# Patient Record
Sex: Male | Born: 1985 | Hispanic: No | Smoking: Never smoker
Health system: Southern US, Community
[De-identification: ages and names within clinical notes are randomized; demographics above are authoritative.]

## PROBLEM LIST (undated history)

## (undated) DIAGNOSIS — F329 Major depressive disorder, single episode, unspecified: Secondary | ICD-10-CM

## (undated) DIAGNOSIS — F32A Depression, unspecified: Secondary | ICD-10-CM

## (undated) DIAGNOSIS — K5792 Diverticulitis of intestine, part unspecified, without perforation or abscess without bleeding: Secondary | ICD-10-CM

## (undated) DIAGNOSIS — I10 Essential (primary) hypertension: Secondary | ICD-10-CM

## (undated) HISTORY — PX: NO PAST SURGERIES: SHX2092

## (undated) HISTORY — PX: COLONOSCOPY: SHX174

---

## 2009-12-14 ENCOUNTER — Emergency Department (HOSPITAL_COMMUNITY): Admission: EM | Admit: 2009-12-14 | Discharge: 2009-12-15 | Payer: Self-pay | Admitting: Emergency Medicine

## 2011-01-16 LAB — RAPID STREP SCREEN (MED CTR MEBANE ONLY): Streptococcus, Group A Screen (Direct): NEGATIVE

## 2011-07-15 ENCOUNTER — Ambulatory Visit: Payer: Self-pay | Admitting: Internal Medicine

## 2015-04-05 ENCOUNTER — Encounter (HOSPITAL_BASED_OUTPATIENT_CLINIC_OR_DEPARTMENT_OTHER): Payer: Self-pay

## 2015-04-05 ENCOUNTER — Emergency Department (HOSPITAL_BASED_OUTPATIENT_CLINIC_OR_DEPARTMENT_OTHER)
Admission: EM | Admit: 2015-04-05 | Discharge: 2015-04-05 | Disposition: A | Discharge status: Home / Self Care | Payer: Self-pay | Attending: Emergency Medicine | Admitting: Emergency Medicine

## 2015-04-05 ENCOUNTER — Emergency Department (HOSPITAL_BASED_OUTPATIENT_CLINIC_OR_DEPARTMENT_OTHER): Payer: Self-pay

## 2015-04-05 DIAGNOSIS — W228XXA Striking against or struck by other objects, initial encounter: Secondary | ICD-10-CM | POA: Insufficient documentation

## 2015-04-05 DIAGNOSIS — Y998 Other external cause status: Secondary | ICD-10-CM | POA: Insufficient documentation

## 2015-04-05 DIAGNOSIS — S9031XA Contusion of right foot, initial encounter: Secondary | ICD-10-CM | POA: Insufficient documentation

## 2015-04-05 DIAGNOSIS — I10 Essential (primary) hypertension: Secondary | ICD-10-CM | POA: Insufficient documentation

## 2015-04-05 DIAGNOSIS — Y9389 Activity, other specified: Secondary | ICD-10-CM | POA: Insufficient documentation

## 2015-04-05 DIAGNOSIS — Y9289 Other specified places as the place of occurrence of the external cause: Secondary | ICD-10-CM | POA: Insufficient documentation

## 2015-04-05 HISTORY — DX: Essential (primary) hypertension: I10

## 2015-04-05 MED ORDER — NAPROXEN 500 MG PO TABS: 500.0000 mg | ORAL_TABLET | Freq: Two times a day (BID) | ORAL | Status: DC

## 2015-04-05 NOTE — ED Provider Notes (Signed)
CSN: [id number]     Arrival date & time 04/05/15  1143 History   First MD Initiated Contact with Patient 04/05/15 1216     Chief Complaint  Patient presents with  . Foot Injury     (Consider location/radiation/quality/duration/timing/severity/associated sxs/prior Treatment) HPI Robert Chen is a 29 y.o. male with history of hypertension, presents to emergency department complaining of right foot injury. Patient states he was at work, where a forklift ran over his foot. He reports pain to the top of the foot. Able to bear weight. No pain with movement of the ankle or the foot. States tender to touch. Denies any other injuries. No treatment prior to coming in. No prior foot injuries.=  Past Medical History  Diagnosis Date  . Hypertension    History reviewed. No pertinent past surgical history. No family history on file. History  Substance Use Topics  . Smoking status: Never Smoker   . Smokeless tobacco: Not on file  . Alcohol Use: No    Review of Systems  Constitutional: Negative for fever and chills.  Musculoskeletal: Positive for joint swelling and arthralgias.  Neurological: Negative for weakness and numbness.      Allergies  Review of patient's allergies indicates no known allergies.  Home Medications   Prior to Admission medications   Medication Sig Start Date End Date Taking? Authorizing Provider  UNKNOWN TO PATIENT BP med   Yes Historical Provider, MD   BP 161/99 mmHg  Pulse 102  Temp(Src) 99.4 F (37.4 C) (Oral)  Resp 18  Ht 6' (1.829 m)  Wt 219 lb (99.338 kg)  BMI 29.70 kg/m2  SpO2 98% Physical Exam  Constitutional: He appears well-developed and well-nourished. No distress.  Eyes: Conjunctivae are normal.  Neck: Neck supple.  Musculoskeletal:  Mild swelling to the dorsal right foot, over 2nd, 3rd, 4th metatarsals. Tender over those areas.  Full rom of the ankle joint. No ttp over bilateral malleoli. Full rom of all toes. Toes are all pink, cap refill  less than 2 seconds distally. Dorsal pedal pulses intact. Sensation intact in all toes distally.  Skin: Skin is warm and dry.  Nursing note and vitals reviewed.   ED Course  Procedures (including critical care time) Labs Review Labs Reviewed - No data to display  Imaging Review Dg Foot Complete Right  04/05/2015   CLINICAL DATA:  Forklift ran over right foot with redness. Initial encounter.  EXAM: RIGHT FOOT COMPLETE - 3+ VIEW  COMPARISON:  None.  FINDINGS: There is no evidence of fracture or dislocation. There is no evidence of arthropathy or other focal bone abnormality. Soft tissues are unremarkable.  IMPRESSION: Negative.   Electronically Signed   By: Marnee Spring M.D.   On: 04/05/2015 12:57     EKG Interpretation None      MDM   Final diagnoses:  Foot contusion, right, initial encounter   Patient is here with right foot crush injury. Mild swelling noted to the dorsal foot. X-rays negative. Patient is neurovascularly intact. Toes are pink, warm, cap refill normal. Sensation intact distally. Will discharge home with naproxen, Ace wrap, elevate, rest, ice. Follow up with his doctor as needed. Results, plan discussed with patient who voiced understanding.  Filed Vitals:   04/05/15 1154  BP: 161/99  Pulse: 102  Temp: 99.4 F (37.4 C)  Resp: [address], PA-C 04/05/15 1306  Rolan Bucco, MD 04/05/15 1512

## 2015-04-05 NOTE — Discharge Instructions (Signed)
Elevate  Your foot at home. Ice several times a day. ACE wrap for swelling. Naprosyn for pain and inflammation. Follow up with your doctor or an urgent care as needed.   Xray shows no fractures.    Contusin en el pie  (Foot Contusion)  Una contusin en el pie es un hematoma profundo en esa zona. Las contusiones son el resultado de una lesin que causa sangrado debajo de la piel. La zona de la contusin puede ponerse [place], [place] o [place]. Las lesiones menores no causan Engineer, mining, Biomedical engineer las ms graves pueden presentar dolor e inflamacin durante un par de semanas. CAUSAS  Una contusin en el pie es consecuencia de un golpe directo en la zona, por ejemplo cuando un objeto cae sobre el pie.  SNTOMAS   Hinchazn.  Cambio de color.  Sensibilidad o dolor en el pie. DIAGNSTICO  Le harn un examen fsico y le preguntarn acerca de su historia. Puede ser necesario que le tomen una radiografa del pie para diagnosticar si hay algn hueso roto (fractura).  TRATAMIENTO  Podrn recomendarle el uso de un vendaje elstico para Orthoptist. Generalmente el mejor tratamiento es el reposo, la elevacin del pie y la aplicacin de compresas fras en la zona. Para calmar el dolor tambin podrn recomendarle medicamentos de venta libre.  INSTRUCCIONES PARA EL CUIDADO EN EL HOGAR   Aplique hielo sobre la zona lesionada.  Ponga el hielo en una bolsa plstica.  Colquese una toalla entre la piel y la bolsa de hielo.  Deje el hielo en el lugar durante 15 a 20 minutos, 3 a 4 veces por da.  Slo tome medicamentos de venta libre o recetados para Primary school teacher, las molestias o bajar la fiebre segn las indicaciones de su mdico.  Use un vendaje elstico si se lo indican. Esto ayuda a Building services engineer. Puede retirar el vendaje para dormir, darse Shaune Spittle y baarse. Si los dedos de los pies estn adormecidos, fros o de [employer], retire el vendaje y vuelva a colocarlo de manera ms floja.  Eleve la  zona lesionada con almohadas para reducir la hinchazn.  Evite permanecer parado o caminar [employer] duele. No reinicie los movimientos hasta que se lo indique el mdico. Luego, comience gradualmente. Si siente dolor, disminuya los movimientos. Aumente gradualmente las actividades que no le causan molestias hasta lograr la actividad normal sin dolor.  Consulte a su mdico como se le indique. Es muy importante cumplir con las visitas de control para evitar problemas crnicos en el pie, inclusive el dolor de larga duracin (crnico) SOLICITE ATENCIN MDICA DE INMEDIATO SI:   Observa un aumento del enrojecimiento, hinchazn o dolor en el pie.  La hinchazn o el dolor no se [employer].  Tiene prdida de la sensibilidad en el pie o no puede mover los dedos.  El pie estn fro, adormecido o de [employer].  Siente dolor al [employer] dedos.  El pie est caliente al tacto.  La contusin no mejora en el trmino de [address]  [address] ASEGRESE DE QUE:   Comprende estas instrucciones.  Controlar su enfermedad.  Solicitar ayuda de inmediato si no mejora o si empeora. Document Released: 01/19/2008 Document Revised: 04/12/2012 [facility] Patient Information 2015 [place], [place]. This information is not intended to replace advice given to you by your health care provider. Make sure you discuss any questions you have with your health care provider.

## 2015-04-05 NOTE — ED Notes (Signed)
Pt states a fork lift ran over his right foot at work today-slight redness/abrasion noted to top of foot

## 2017-02-18 ENCOUNTER — Inpatient Hospital Stay (HOSPITAL_COMMUNITY)
Admission: EM | Admit: 2017-02-18 | Discharge: 2017-02-22 | DRG: 392 | Disposition: A | Discharge status: Home / Self Care | Payer: Self-pay | Attending: Surgery | Admitting: Surgery

## 2017-02-18 ENCOUNTER — Encounter (HOSPITAL_COMMUNITY): Payer: Self-pay | Admitting: Adult Health

## 2017-02-18 DIAGNOSIS — K5720 Diverticulitis of large intestine with perforation and abscess without bleeding: Principal | ICD-10-CM | POA: Diagnosis present

## 2017-02-18 DIAGNOSIS — R1030 Lower abdominal pain, unspecified: Secondary | ICD-10-CM

## 2017-02-18 DIAGNOSIS — Z789 Other specified health status: Secondary | ICD-10-CM

## 2017-02-18 DIAGNOSIS — I10 Essential (primary) hypertension: Secondary | ICD-10-CM | POA: Diagnosis present

## 2017-02-18 DIAGNOSIS — Z791 Long term (current) use of non-steroidal anti-inflammatories (NSAID): Secondary | ICD-10-CM

## 2017-02-18 DIAGNOSIS — R000 Tachycardia, unspecified: Secondary | ICD-10-CM

## 2017-02-18 HISTORY — DX: Diverticulitis of intestine, part unspecified, without perforation or abscess without bleeding: K57.92

## 2017-02-18 LAB — URINALYSIS, ROUTINE W REFLEX MICROSCOPIC
Bilirubin Urine: NEGATIVE
GLUCOSE, UA: NEGATIVE mg/dL
Hgb urine dipstick: NEGATIVE
Ketones, ur: 5 mg/dL — AB
LEUKOCYTES UA: NEGATIVE
Nitrite: NEGATIVE
PROTEIN: NEGATIVE mg/dL
Specific Gravity, Urine: 1.021 (ref 1.005–1.030)
pH: 7 (ref 5.0–8.0)

## 2017-02-18 LAB — COMPREHENSIVE METABOLIC PANEL
ALT: 21 U/L (ref 17–63)
ANION GAP: 11 (ref 5–15)
AST: 21 U/L (ref 15–41)
Albumin: 4.1 g/dL (ref 3.5–5.0)
Alkaline Phosphatase: 93 U/L (ref 38–126)
BUN: 12 mg/dL (ref 6–20)
CO2: 23 mmol/L (ref 22–32)
CREATININE: 1.06 mg/dL (ref 0.61–1.24)
Calcium: 9.1 mg/dL (ref 8.9–10.3)
Chloride: 103 mmol/L (ref 101–111)
Glucose, Bld: 138 mg/dL — ABNORMAL HIGH (ref 65–99)
POTASSIUM: 4 mmol/L (ref 3.5–5.1)
Sodium: 137 mmol/L (ref 135–145)
Total Bilirubin: 1 mg/dL (ref 0.3–1.2)
Total Protein: 7.8 g/dL (ref 6.5–8.1)

## 2017-02-18 LAB — CBC
HCT: 37.4 % — ABNORMAL LOW (ref 39.0–52.0)
Hemoglobin: 11.7 g/dL — ABNORMAL LOW (ref 13.0–17.0)
MCH: 23.3 pg — ABNORMAL LOW (ref 26.0–34.0)
MCHC: 31.3 g/dL (ref 30.0–36.0)
MCV: 74.5 fL — ABNORMAL LOW (ref 78.0–100.0)
PLATELETS: 345 10*3/uL (ref 150–400)
RBC: 5.02 MIL/uL (ref 4.22–5.81)
RDW: 15.4 % (ref 11.5–15.5)
WBC: 17.8 10*3/uL — AB (ref 4.0–10.5)

## 2017-02-18 LAB — LIPASE, BLOOD: LIPASE: 16 U/L (ref 11–51)

## 2017-02-18 MED ORDER — ACETAMINOPHEN 500 MG PO TABS
1000.0000 mg | ORAL_TABLET | Freq: Once | ORAL | Status: AC
  Administered 2017-02-18: 1000 mg via ORAL
  Filled 2017-02-18: qty 2

## 2017-02-18 MED ORDER — IOPAMIDOL (ISOVUE-300) INJECTION 61%
INTRAVENOUS | Status: AC
  Administered 2017-02-19: 100 mL
  Filled 2017-02-18: qty 100

## 2017-02-18 MED ORDER — ONDANSETRON 4 MG PO TBDP: 4.0000 mg | ORAL_TABLET | Freq: Once | ORAL | Status: DC | PRN

## 2017-02-18 MED ORDER — SODIUM CHLORIDE 0.9 % IV BOLUS (SEPSIS)
1000.0000 mL | Freq: Once | INTRAVENOUS | Status: AC
  Administered 2017-02-18: 1000 mL via INTRAVENOUS

## 2017-02-18 NOTE — ED Provider Notes (Signed)
[facility]-EMERGENCY DEPT Provider Note   CSN: [id number] Arrival date & time: 02/18/17  1929     History   Chief Complaint Chief Complaint  Patient presents with  . Abdominal Pain    HPI Robert Chen is a 31 y.o. male.  31yo M who p/w abdominal pain. The patient began having crampy, intermittent lower abdominal pain that started this morning and has continued throughout the day. The pain is sometimes worse with movement. Sometimes the pain is better when he lays down. His last bowel movement was today and was normal. He denies any diarrhea. He has had 2 episodes of vomiting as well as ongoing nausea. He states that he feels full. No urinary symptoms, cough/cold symptoms. His daughter was sick with strep throat recently but he denies any sore throat.   The history is provided by the patient.  Abdominal Pain      Past Medical History:  Diagnosis Date  . Hypertension     There are no active problems to display for this patient.   History reviewed. No pertinent surgical history.     Home Medications    Prior to Admission medications   Medication Sig Start Date End Date Taking? Authorizing Provider  naproxen (NAPROSYN) 500 MG tablet Take 1 tablet (500 mg total) by mouth 2 (two) times daily. 04/05/15   Tatyana Kirichenko, PA-C  UNKNOWN TO PATIENT BP med    Historical Provider, MD    Family History History reviewed. No pertinent family history.  Social History Social History  Substance Use Topics  . Smoking status: Never Smoker  . Smokeless tobacco: Not on file  . Alcohol use No     Allergies   Patient has no known allergies.   Review of Systems Review of Systems  Gastrointestinal: Positive for abdominal pain.   All other systems reviewed and are negative except that which was mentioned in HPI  Physical Exam Updated Vital Signs BP 137/65   Pulse (!) 122   Temp (!) 100.4 F (38 C) (Oral)   Resp 18   Ht  (1.727 m)   Wt 220 lb (99.8 kg)   SpO2 98%    BMI 33.45 kg/m   Physical Exam  Constitutional: He is oriented to person, place, and time. He appears well-developed and well-nourished. No distress.  HENT:  Head: Normocephalic and atraumatic.  Moist mucous membranes  Eyes: Conjunctivae are normal. Pupils are equal, round, and reactive to light.  Neck: Neck supple.  Cardiovascular: Normal rate, regular rhythm and normal heart sounds.   No murmur heard. Pulmonary/Chest: Effort normal and breath sounds normal.  Abdominal: Soft. Bowel sounds are normal. He exhibits no distension. There is tenderness (RLQ, suprapubic, and LLQ).  Musculoskeletal: He exhibits no edema.  Neurological: He is alert and oriented to person, place, and time.  Fluent speech  Skin: Skin is warm and dry.  Psychiatric: He has a normal mood and affect. Judgment normal.  Nursing note and vitals reviewed.    ED Treatments / Results  Labs (all labs ordered are listed, but only abnormal results are displayed) Labs Reviewed  COMPREHENSIVE METABOLIC PANEL - Abnormal; Notable for the following:       Result Value   Glucose, Bld 138 (*)    All other components within normal limits  CBC - Abnormal; Notable for the following:    WBC 17.8 (*)    Hemoglobin 11.7 (*)    HCT 37.4 (*)    MCV 74.5 (*)    [facility]  23.3 (*)    All other components within normal limits  URINALYSIS, ROUTINE W REFLEX MICROSCOPIC - Abnormal; Notable for the following:    Ketones, ur 5 (*)    All other components within normal limits  LIPASE, BLOOD    EKG  EKG Interpretation None       Radiology No results found.  Procedures Procedures (including critical care time)  Medications Ordered in ED Medications  ondansetron (ZOFRAN-ODT) disintegrating tablet 4 mg (not administered)  iopamidol (ISOVUE-300) 61 % injection (not administered)  sodium chloride 0.9 % bolus 1,000 mL (1,000 mLs Intravenous New Bag/Given 02/18/17 2256)  acetaminophen (TYLENOL) tablet 1,000 mg (1,000 mg Oral  Given 02/18/17 2305)     Initial Impression / Assessment and Plan / ED Course  I have reviewed the triage vital signs and the nursing notes.  Pertinent labs & imaging results that were available during my care of the patient were reviewed by me and considered in my medical decision making (see chart for details).     Patient presents with 1 day of lower abdominal pain associated with vomiting. He was nontoxic on exam, he eventually spiked a fever to 100.4 and was tachycardic but blood pressure reassuring. He had lower abdominal tenderness worst in the right lower quadrant and suprapubic abdomen. No peritonitis. Gave the patient Zofran, Tylenol, and an IV fluid bolus. Obtained above labs.  Labwork notable for WBC 17.8, hemoglobin 11.7, normal CMP. Because of his lower abdominal pain in the setting of fever and leukocytosis, obtained CT of abdomen and pelvis to rule out acute process such as appendicitis.   I'm signing out to the oncoming provider who will follow up on CT imaging. Final Clinical Impressions(s) / ED Diagnoses   Final diagnoses:  Lower abdominal pain    New Prescriptions New Prescriptions   No medications on file     Laurence Spates, MD 02/18/17 [phone]

## 2017-02-18 NOTE — ED Notes (Signed)
Patient transported to CT 

## 2017-02-18 NOTE — ED Triage Notes (Signed)
PResents with cramping bilateral lower quandrant abdominal pain that began this AM and described as cramping, sitting sometimes makes the pain worse, lying down makes pain better. Denies diarrhea. LAst BM today and was normal. Endorses vomiting x2 times today, constant nausea. Endorses feeling full and having a lot of gas feeling.abdomen is nnotender to palpation, non distended. HR 130s.

## 2017-02-19 ENCOUNTER — Encounter (HOSPITAL_COMMUNITY): Payer: Self-pay | Admitting: Radiology

## 2017-02-19 ENCOUNTER — Emergency Department (HOSPITAL_COMMUNITY): Payer: Self-pay

## 2017-02-19 DIAGNOSIS — K5720 Diverticulitis of large intestine with perforation and abscess without bleeding: Secondary | ICD-10-CM | POA: Diagnosis present

## 2017-02-19 LAB — CBC
HCT: 35.2 % — ABNORMAL LOW (ref 39.0–52.0)
Hemoglobin: 11.2 g/dL — ABNORMAL LOW (ref 13.0–17.0)
MCH: 23.7 pg — AB (ref 26.0–34.0)
MCHC: 31.8 g/dL (ref 30.0–36.0)
MCV: 74.6 fL — ABNORMAL LOW (ref 78.0–100.0)
PLATELETS: 315 10*3/uL (ref 150–400)
RBC: 4.72 MIL/uL (ref 4.22–5.81)
RDW: 15.4 % (ref 11.5–15.5)
WBC: 16.3 10*3/uL — AB (ref 4.0–10.5)

## 2017-02-19 LAB — BASIC METABOLIC PANEL
Anion gap: 9 (ref 5–15)
BUN: 10 mg/dL (ref 6–20)
CALCIUM: 8.4 mg/dL — AB (ref 8.9–10.3)
CO2: 23 mmol/L (ref 22–32)
CREATININE: 1.01 mg/dL (ref 0.61–1.24)
Chloride: 104 mmol/L (ref 101–111)
GFR calc non Af Amer: >60 mL/min (ref >60)
Glucose, Bld: 126 mg/dL — ABNORMAL HIGH (ref 65–99)
Potassium: 3.7 mmol/L (ref 3.5–5.1)
SODIUM: 136 mmol/L (ref 135–145)

## 2017-02-19 LAB — HIV ANTIBODY (ROUTINE TESTING W REFLEX): HIV Screen 4th Generation wRfx: NONREACTIVE

## 2017-02-19 MED ORDER — KETOROLAC TROMETHAMINE 15 MG/ML IJ SOLN: 15.0000 mg | Freq: Three times a day (TID) | INTRAMUSCULAR | Status: DC | PRN

## 2017-02-19 MED ORDER — ENOXAPARIN SODIUM 40 MG/0.4ML ~~LOC~~ SOLN
40.0000 mg | SUBCUTANEOUS | Status: DC
  Filled 2017-02-19: qty 0.4

## 2017-02-19 MED ORDER — PIPERACILLIN-TAZOBACTAM 3.375 G IVPB 30 MIN
3.3750 g | Freq: Once | INTRAVENOUS | Status: AC
  Administered 2017-02-19: 3.375 g via INTRAVENOUS
  Filled 2017-02-19: qty 50

## 2017-02-19 MED ORDER — ACETAMINOPHEN 325 MG PO TABS
650.0000 mg | ORAL_TABLET | Freq: Four times a day (QID) | ORAL | Status: DC | PRN
  Administered 2017-02-21: 650 mg via ORAL
  Filled 2017-02-19: qty 2

## 2017-02-19 MED ORDER — HYDROMORPHONE HCL 1 MG/ML IJ SOLN: 1.0000 mg | INTRAMUSCULAR | Status: DC | PRN

## 2017-02-19 MED ORDER — SODIUM CHLORIDE 0.9 % IV SOLN
INTRAVENOUS | Status: DC
  Administered 2017-02-19 – 2017-02-21 (×6): via INTRAVENOUS

## 2017-02-19 MED ORDER — PIPERACILLIN-TAZOBACTAM 3.375 G IVPB
3.3750 g | Freq: Three times a day (TID) | INTRAVENOUS | Status: DC
  Administered 2017-02-19 – 2017-02-22 (×9): 3.375 g via INTRAVENOUS
  Filled 2017-02-19 (×10): qty 50

## 2017-02-19 MED ORDER — ONDANSETRON 4 MG PO TBDP: 4.0000 mg | ORAL_TABLET | Freq: Four times a day (QID) | ORAL | Status: DC | PRN

## 2017-02-19 MED ORDER — ACETAMINOPHEN 650 MG RE SUPP: 650.0000 mg | Freq: Four times a day (QID) | RECTAL | Status: DC | PRN

## 2017-02-19 MED ORDER — ONDANSETRON HCL 4 MG/2ML IJ SOLN: 4.0000 mg | Freq: Four times a day (QID) | INTRAMUSCULAR | Status: DC | PRN

## 2017-02-19 NOTE — Discharge Summary (Signed)
[facility] Discharge Summary   Patient ID: Robert Chen MRN: [id number] DOB/AGE: 33-23-1987 33 y.o.  Admit date: 02/18/2017 Discharge date: 02/22/2017  Discharge Diagnosis Patient Active Problem List   Diagnosis Date Noted  . Uses Spanish as primary spoken language 02/20/2017  . Sinus tachycardia 02/20/2017  . Hypertension   . Sigmoid diverticulitis with small perforatrion 02/19/2017    Imaging: No results found. Procedures none  Hospital Course:  Robert Chen is a 31 y.o. male who presented to [facility] with 24 hours of RLQ and suprapubic abdominal pain.  Workup significant for leukocytosis (17,800) and above CT scan consistent with acute sigmoid diverticulitis with microperforation. He reports no prior history of diverticulitis.  Patient was admitted to the hopsital, placed on bowel rest, and started on IV antibiotics.  He is tolerating a full soft diet with Pain. WBC is stable. It was Dr. Corliss Skains opinion he be discharged home on oral antibiotics. I discussed discharge plans with his wife. He will go home on 1 more week of Augmentin and a probiotic. He is to follow-up in one week with primary care physician which she will obtain. If he has problems in the interim he can follow up in the ED or with his primary care doctor.  He would need a repeat CT scan and labs if this should recur.  Physical Exam: General:  Alert, NAD, pleasant, comfortable CV: RRR, pedal pulses 2+ Resp: normal effort, CTAB Abd:  Soft, non-tender, non-distended, +BS  CBC Latest Ref Rng & Units 02/22/2017 02/21/2017 02/20/2017  WBC 4.0 - 10.5 K/uL 12.3(H) 11.7(H) 12.9(H)  Hemoglobin 13.0 - 17.0 g/dL 11.0(L) 11.3(L) 11.1(L)  Hematocrit 39.0 - 52.0 % 34.8(L) 36.6(L) 36.0(L)  Platelets 150 - 400 K/uL 360 337 304   CMP Latest Ref Rng & Units 02/20/2017 02/19/2017 02/18/2017  Glucose 65 - 99 mg/dL 87 347(Q) 259(D)  BUN 6 - 20 mg/dL Creatinine 0.61 - 1.24 mg/dL 6.38 7.56 4.33  Sodium 135 - 145 mmol/L  137 136 137  Potassium 3.5 - 5.1 mmol/L 4.3 3.7 4.0  Chloride 101 - 111 mmol/L 108 104 103  CO2 22 - 32 mmol/L Calcium 8.9 - 10.3 mg/dL 2.9(J) 1.8(A) 9.1  Total Protein 6.5 - 8.1 g/dL - - 7.8  Total Bilirubin 0.3 - 1.2 mg/dL - - 1.0  Alkaline Phos 38 - 126 U/L - - 93  AST 15 - 41 U/L - - 21  ALT 17 - 63 U/L - - 21   Condition ON discharge: Improved.  Allergies as of 02/22/2017   No Known Allergies     Medication List    TAKE these medications   acetaminophen 325 MG tablet Commonly known as:  TYLENOL Take 650 mg by mouth every 6 (six) hours as needed for mild pain.   amoxicillin-clavulanate 875-125 MG tablet Commonly known as:  AUGMENTIN Take 1 tablet by mouth every 12 (twelve) hours.   saccharomyces boulardii 250 MG capsule Commonly known as:  FLORASTOR You can buy this at any drug store over the counter.  Follow directions and complete 1 package.        Follow-up Information    Contact and set up Primary Care Follow up.   Why:  CAll and arrange follow up with a primary care physician.  Let them check your labs and follow you for this.  If you have fever or increased pain before your follow up return to the ER for evaluation.  [facility] Follow up.   Specialty:  General Surgery Why:  We took care of you during your hospital stay.  If you have an issue call and let us know.  You will need to see your primary care doctor or return to the emergency department if you have further problems. Contact information: [address] [place] [address] [phone]          Signed: Hosie Spangle, [facility] 02/22/2017, 10:01 AM Pager: [phone] Consults: [phone] Mon-Fri 7:00 am-4:30 pm Sat-Sun 7:00 am-11:30 am

## 2017-02-19 NOTE — ED Provider Notes (Signed)
Patient signed out to me to follow-up on CT scan. Patient originally evaluated for abdominal pain. Patient was afebrile but experiencing mild tachycardia and did have leukocytosis. CAT scan reviewed and does show evidence of sigmoid diverticulitis with extraluminal air consistent with microperforation. Discussed briefly with Dr. Dwain Sarna, general surgery. Will initiate Zosyn and he will see the patient in the ER.   Gilda Crease, MD 02/19/17 [phone]

## 2017-02-19 NOTE — H&P (Signed)
Robert Chen is an 31 y.o. male.   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 Does not radiate. Has subjective fever. No emesis. Normal bm today.  Nothing making it better.  He has no prior history.    Past Medical History:  Diagnosis Date  . Hypertension     History reviewed. No pertinent surgical history.  History reviewed. No pertinent family history. Social History:  reports that he has never smoked. He does not have any smokeless tobacco history on file. He reports that he does not drink alcohol or use drugs.  Allergies: No Known Allergies  meds none  Results for orders placed or performed during the hospital encounter of 02/18/17 (from the past 48 hour(s))  Lipase, blood     Status: None   Collection Time: 02/18/17  7:51 PM  Result Value Ref Range   Lipase 16 11 - 51 U/L  Comprehensive metabolic panel     Status: Abnormal   Collection Time: 02/18/17  7:51 PM  Result Value Ref Range   Sodium 137 135 - 145 mmol/L   Potassium 4.0 3.5 - 5.1 mmol/L   Chloride 103 101 - 111 mmol/L   CO2 23 22 - 32 mmol/L   Glucose, Bld 138 (H) 65 - 99 mg/dL   BUN 12 6 - 20 mg/dL   Creatinine, Ser 1.06 0.61 - 1.24 mg/dL   Calcium 9.1 8.9 - 10.3 mg/dL   Total Protein 7.8 6.5 - 8.1 g/dL   Albumin 4.1 3.5 - 5.0 g/dL   AST 21 15 - 41 U/L   ALT 21 17 - 63 U/L   Alkaline Phosphatase 93 38 - 126 U/L   Total Bilirubin 1.0 0.3 - 1.2 mg/dL   GFR calc non Af Amer >60 >60 mL/min   GFR calc Af Amer >60 >60 mL/min    Comment: (NOTE) The eGFR has been calculated using the CKD EPI equation. This calculation has not been validated in all clinical situations. eGFR's persistently <60 mL/min signify possible Chronic Kidney Disease.    Anion gap 11 5 - 15  CBC     Status: Abnormal   Collection Time: 02/18/17  7:51 PM  Result Value Ref Range   WBC 17.8 (H) 4.0 - 10.5 K/uL   RBC 5.02 4.22 - 5.81 MIL/uL   Hemoglobin  11.7 (L) 13.0 - 17.0 g/dL   HCT 37.4 (L) 39.0 - 52.0 %   MCV 74.5 (L) 78.0 - 100.0 fL   MCH 23.3 (L) 26.0 - 34.0 pg   MCHC 31.3 30.0 - 36.0 g/dL   RDW 15.4 11.5 - 15.5 %   Platelets 345 150 - 400 K/uL  Urinalysis, Routine w reflex microscopic     Status: Abnormal   Collection Time: 02/18/17  7:53 PM  Result Value Ref Range   Color, Urine YELLOW YELLOW   APPearance CLEAR CLEAR   Specific Gravity, Urine 1.021 1.005 - 1.030   pH 7.0 5.0 - 8.0   Glucose, UA NEGATIVE NEGATIVE mg/dL   Hgb urine dipstick NEGATIVE NEGATIVE   Bilirubin Urine NEGATIVE NEGATIVE   Ketones, ur 5 (A) NEGATIVE mg/dL   Protein, ur NEGATIVE NEGATIVE mg/dL   Nitrite NEGATIVE NEGATIVE   Leukocytes, UA NEGATIVE NEGATIVE   Ct Abdomen Pelvis W Contrast  Result Date: 02/19/2017 CLINICAL DATA:  31 year old male with right lower quadrant abdominal pain. EXAM: CT ABDOMEN AND PELVIS WITH CONTRAST TECHNIQUE: Multidetector CT  imaging of the abdomen and pelvis was performed using the standard protocol following bolus administration of intravenous contrast. CONTRAST:  117m ISOVUE-300 IOPAMIDOL (ISOVUE-300) INJECTION 61% COMPARISON:  None. FINDINGS: Lower chest: The visualized lung bases are clear. Small amount of free fluid noted in the right hemipelvis. Multiple tiny pockets of focally contained extraluminal air. Hepatobiliary: No focal liver abnormality is seen. No gallstones, gallbladder wall thickening, or biliary dilatation. Pancreas: Unremarkable. No pancreatic ductal dilatation or surrounding inflammatory changes. Spleen: Normal in size without focal abnormality. Adrenals/Urinary Tract: Adrenal glands are unremarkable. Kidneys are normal, without renal calculi, focal lesion, or hydronephrosis. Bladder is unremarkable. Stomach/Bowel: There are multiple sigmoid diverticula. There is segmental thickening and inflammatory changes of the sigmoid colon centered at a sigmoid diverticula. There is perisigmoid stranding with multiple small  pockets of extraluminal air. Findings most consistent with sigmoid diverticulitis with focally contained microperforation. No drainable fluid collection or abscess. There is no evidence of bowel obstruction. Normal appendix. Vascular/Lymphatic: No significant vascular findings are present. No enlarged abdominal or pelvic lymph nodes. Reproductive: The prostate and seminal vesicles are grossly unremarkable. Other: None Musculoskeletal: No acute or significant osseous findings. IMPRESSION: Sigmoid diverticulitis with focally contained microperforations. No abscess. Electronically Signed   By: AAnner CreteM.D.   On: 02/19/2017 00:48    Review of Systems  Constitutional: Positive for fever.  Gastrointestinal: Positive for abdominal pain.  All other systems reviewed and are negative.   Blood pressure 129/63, pulse (!) 118, temperature 98.7 F (37.1 C), temperature source Oral, resp. rate 18, height 5' 8"  (1.727 m), weight 99.8 kg (220 lb), SpO2 99 %. Physical Exam  Vitals reviewed. Constitutional: He is oriented to person, place, and time. He appears well-developed and well-nourished.  HENT:  Head: Normocephalic and atraumatic.  Right Ear: External ear normal.  Left Ear: External ear normal.  Mouth/Throat: Oropharynx is clear and moist.  Eyes: EOM are normal. Pupils are equal, round, and reactive to light.  Neck: Neck supple.  Cardiovascular: Regular rhythm.  Tachycardia present.   Respiratory: Effort normal and breath sounds normal. He has no wheezes. He has no rales.  GI: Soft. Bowel sounds are normal. He exhibits no distension. There is tenderness in the right lower quadrant and suprapubic area.  Musculoskeletal: He exhibits no edema or tenderness.  Lymphadenopathy:    He has no cervical adenopathy.  Neurological: He is alert and oriented to person, place, and time. He has normal strength.     Assessment/Plan Diverticulitis  He has elevated wbc and tachycardia with some small  areas of extraluminal air.  I dont think he needs surgery now but does need admission with npo,abx and hopefully will resolve conservatively  WRolm Bookbinder MD 02/19/2017, 1:41 AM

## 2017-02-19 NOTE — Progress Notes (Signed)
[facility] Progress Note     Subjective: CC: abdominal pain First episode of diverticulitis. Abdominal pain improved compared to yesterday, limited to RLQ. Denies nausea/vomiting. Had a BM yesterday that was "normal". Plans to ambulate in hall today.  Objective: Vital signs in last 24 hours: Temp:  [98.1 F (36.7 C)-100.4 F (38 C)] 98.1 F (36.7 C) (04/27 0433) Pulse Rate:  [107-135] 119 (04/27 0433) Resp:  [18-20] 18 (04/27 0433) BP: (99-169)/(54-94) 138/94 (04/27 0433) SpO2:  [96 %-100 %] 100 % (04/27 0433) Weight:  [99.8 kg (220 lb)] 99.8 kg (220 lb) (04/26 1947) Last BM Date: 02/18/17  Intake/Output from previous day: 04/26 0701 - 04/27 0700 In: 1093.8 [I.V.:43.8; IV Piggyback:1050] Out: -  Intake/Output this shift: No intake/output data recorded.  PE: Gen:  Alert, NAD, pleasant Card:  Regular rate and rhythm, pedal pulses 2+ BL Pulm:  Normal effort, clear to auscultation bilaterally Abd: Soft, TTP RLQ, palpation of LLQ elicits pain in RLQ, non-distended, hyperactive bowel sounds present in all 4 quadrants Skin: warm and dry, no rashes  Psych: A&Ox3   Lab Results:   Recent Labs  02/18/17 1951 02/19/17 0447  WBC 17.8* 16.3*  HGB 11.7* 11.2*  HCT 37.4* 35.2*  PLT 345 315   BMET  Recent Labs  02/18/17 1951 02/19/17 0447  NA 137 136  K 4.0 3.7  CL 103 104  CO2 23 23  GLUCOSE 138* 126*  BUN 12 10  CREATININE 1.06 1.01  CALCIUM 9.1 8.4*   PT/INR No results for input(s): LABPROT, INR in the last 72 hours. CMP     Component Value Date/Time   NA 136 02/19/2017 0447   K 3.7 02/19/2017 0447   CL 104 02/19/2017 0447   CO2 23 02/19/2017 0447   GLUCOSE 126 (H) 02/19/2017 0447   BUN 10 02/19/2017 0447   CREATININE 1.01 02/19/2017 0447   CALCIUM 8.4 (L) 02/19/2017 0447   PROT 7.8 02/18/2017 1951   ALBUMIN 4.1 02/18/2017 1951   AST 21 02/18/2017 1951   ALT 21 02/18/2017 1951   ALKPHOS 93 02/18/2017 1951   BILITOT 1.0 02/18/2017 1951    GFRNONAA >60 02/19/2017 0447   GFRAA >60 02/19/2017 0447   Lipase     Component Value Date/Time   LIPASE 16 02/18/2017 1951       Studies/Results: Ct Abdomen Pelvis W Contrast  Result Date: 02/19/2017 CLINICAL DATA:  31 year old male with right lower quadrant abdominal pain. EXAM: CT ABDOMEN AND PELVIS WITH CONTRAST TECHNIQUE: Multidetector CT imaging of the abdomen and pelvis was performed using the standard protocol following bolus administration of intravenous contrast. CONTRAST:  ISOVUE-300 IOPAMIDOL (ISOVUE-300) INJECTION 61% COMPARISON:  None. FINDINGS: Lower chest: The visualized lung bases are clear. Small amount of free fluid noted in the right hemipelvis. Multiple tiny pockets of focally contained extraluminal air. Hepatobiliary: No focal liver abnormality is seen. No gallstones, gallbladder wall thickening, or biliary dilatation. Pancreas: Unremarkable. No pancreatic ductal dilatation or surrounding inflammatory changes. Spleen: Normal in size without focal abnormality. Adrenals/Urinary Tract: Adrenal glands are unremarkable. Kidneys are normal, without renal calculi, focal lesion, or hydronephrosis. Bladder is unremarkable. Stomach/Bowel: There are multiple sigmoid diverticula. There is segmental thickening and inflammatory changes of the sigmoid colon centered at a sigmoid diverticula. There is perisigmoid stranding with multiple small pockets of extraluminal air. Findings most consistent with sigmoid diverticulitis with focally contained microperforation. No drainable fluid collection or abscess. There is no evidence of bowel obstruction. Normal appendix. Vascular/Lymphatic: No significant  vascular findings are present. No enlarged abdominal or pelvic lymph nodes. Reproductive: The prostate and seminal vesicles are grossly unremarkable. Other: None Musculoskeletal: No acute or significant osseous findings. IMPRESSION: Sigmoid diverticulitis with focally contained microperforations.  No abscess. Electronically Signed   By: Elgie Collard M.D.   On: 02/19/2017 00:48    Anti-infectives: Anti-infectives    Start     Dose/Rate Route Frequency Ordered Stop   02/19/17 1000  piperacillin-tazobactam (ZOSYN) IVPB 3.375 g     3.375 g 12.5 mL/hr over 240 Minutes Intravenous Every 8 hours 02/19/17 0428     02/19/17 0130  piperacillin-tazobactam (ZOSYN) IVPB 3.375 g     3.375 g 100 mL/hr over 30 Minutes Intravenous  Once 02/19/17 0122 02/19/17 0216     Assessment/Plan Acute diverticulitis  - NPO, IVF, bowel rest - IV abx - pain control - mobilize   Leukocytosis - 16.3, 2/2 above  ID - Zosyn 4/27 >>  VTE - lovenox, SCD's   LOS: 0 days    Adam Phenix , [facility] 02/19/2017, 9:38 AM Pager: [phone] Consults: [phone] Mon-Fri 7:00 am-4:30 pm Sat-Sun 7:00 am-11:30 am

## 2017-02-19 NOTE — Progress Notes (Signed)
Pharmacy Antibiotic Note  Robert Chen is a 31 y.o. male admitted on 02/18/2017 with intra-abdominal infection.  Pharmacy has been consulted for Zosyn dosing. WBC elevated. Renal function good.   Plan: -Zosyn 3.375G IV q8h to be infused over 4 hours -Trend WBC, temp, renal function   Height:  (172.7 cm) Weight: 220 lb (99.8 kg) IBW/kg (Calculated) : 68.4  Temp (24hrs), Avg:99.2 F (37.3 C), Min:98.6 F (37 C), Max:100.4 F (38 C)   Recent Labs Lab 02/18/17 1951  WBC 17.8*  CREATININE 1.06    Estimated Creatinine Clearance: 114.6 mL/min (by C-G formula based on SCr of 1.06 mg/dL).    No Known Allergies   Robert Chen 02/19/2017 4:26 AM

## 2017-02-20 DIAGNOSIS — I10 Essential (primary) hypertension: Secondary | ICD-10-CM | POA: Diagnosis present

## 2017-02-20 DIAGNOSIS — Z789 Other specified health status: Secondary | ICD-10-CM

## 2017-02-20 DIAGNOSIS — R000 Tachycardia, unspecified: Secondary | ICD-10-CM

## 2017-02-20 LAB — BASIC METABOLIC PANEL
Anion gap: 7 (ref 5–15)
BUN: 11 mg/dL (ref 6–20)
CO2: 22 mmol/L (ref 22–32)
CREATININE: 0.95 mg/dL (ref 0.61–1.24)
Calcium: 8.4 mg/dL — ABNORMAL LOW (ref 8.9–10.3)
Chloride: 108 mmol/L (ref 101–111)
Glucose, Bld: 87 mg/dL (ref 65–99)
POTASSIUM: 4.3 mmol/L (ref 3.5–5.1)
SODIUM: 137 mmol/L (ref 135–145)

## 2017-02-20 LAB — CBC
HCT: 36 % — ABNORMAL LOW (ref 39.0–52.0)
HEMOGLOBIN: 11.1 g/dL — AB (ref 13.0–17.0)
MCH: 23.2 pg — AB (ref 26.0–34.0)
MCHC: 30.8 g/dL (ref 30.0–36.0)
MCV: 75.2 fL — ABNORMAL LOW (ref 78.0–100.0)
PLATELETS: 304 10*3/uL (ref 150–400)
RBC: 4.79 MIL/uL (ref 4.22–5.81)
RDW: 15.5 % (ref 11.5–15.5)
WBC: 12.9 10*3/uL — ABNORMAL HIGH (ref 4.0–10.5)

## 2017-02-20 MED ORDER — METOPROLOL TARTRATE 12.5 MG HALF TABLET: 12.5000 mg | ORAL_TABLET | Freq: Two times a day (BID) | ORAL | Status: DC | PRN

## 2017-02-20 MED ORDER — METOPROLOL TARTRATE 5 MG/5ML IV SOLN: 5.0000 mg | Freq: Four times a day (QID) | INTRAVENOUS | Status: DC | PRN

## 2017-02-20 NOTE — Progress Notes (Addendum)
[address] [place], [place] [address] Phone: [phone] FAX: [place] [phone] 04-07-1986    Problem List:   Principal Problem:   Sigmoid diverticulitis with small perforatrion Active Problems:   Hypertension   Uses Spanish as primary spoken language   Sinus tachycardia           Assessment  Perf diverticulitis, recovering  Plan:  -try clears.  Adv to fulls at most -continue IV ABx -wean IVf -HR improved but guarded - metoprolol PRN & follow -VTE prophylaxis- SCDs, etc -mobilize as tolerated to help recovery  -most likely would benefit w elective colectomy given microperf & young age - see if he can recover from this event 1st  Adin Hector, M.D., F.A.C.S. Gastrointestinal and Minimally Invasive Surgery [facility] [address] [place], [place] [address] [phone] Main / Paging   02/20/2017  CARE TEAM:  PCP: No PCP Per Patient  Outpatient Care Team: Patient Care Team: No Pcp Per Patient as PCP - General (General Practice)  Inpatient Treatment Team: Treatment Team: Attending Provider: Nolon Nations, MD; Rounding Team: Md Edison Pace, MD; Registered Nurse: Roselind Rily, RN; Registered Nurse: Ellouise Newer, RN; Registered Nurse: Roetta Sessions, RN; Technician: Lindajo Royal, NT  Subjective:  Denies pain Wants to eat Hoping to go home  Objective:  Vital signs:  Vitals:   02/19/17 1358 02/19/17 2136 02/20/17 0206 02/20/17 0534  BP: 134/79 (!) 147/92 139/76 128/79  Pulse: (!) 117 (!) 115 (!) 105 (!) 108  Resp: _0 Temp: 98.3 F (36.8 C) 99.1 F (37.3 C) 98.2 F (36.8 C) 98.3 F (36.8 C)  TempSrc: Oral Oral Oral Oral  SpO2: 100% 100% 100% 100%  Weight:      Height:        Last BM Date: 02/18/17  Intake/Output   Yesterday:  04/27 0701 - 04/28 0700 In: 1301 [P.O.:1; I.V.:1250; IV  Piggyback:50] Out: -  This shift:  No intake/output data recorded.  Bowel function:  Flatus: YES - scant w straining  BM:  No  Drain: (No drain)   Physical Exam:  General: Pt awake/alert/oriented x4 inno acute distress Eyes: PERRL, normal EOM.  Sclera clear.  No icterus Neuro: CN II-XII intact w/o focal sensory/motor deficits. Lymph: No head/neck/groin lymphadenopathy Psych:  No delerium/psychosis/paranoia HENT: Normocephalic, Mucus membranes moist.  No thrush Neck: Supple, No tracheal deviation Chest: No chest wall pain w good excursion CV:  Pulses intact.  Regular rhythm MS: Normal AROM mjr joints.  No obvious deformity Abdomen: Somewhat firm.  Moderately distended.  Nontender.  No evidence of peritonitis.  No incarcerated hernias. Ext:  SCDs BLE.  No mjr edema.  No cyanosis Skin: No petechiae / purpura  Results:   Labs: Results for orders placed or performed during the hospital encounter of 02/18/17 (from the past 48 hour(s))  Lipase, blood     Status: None   Collection Time: 02/18/17  7:51 PM  Result Value Ref Range   Lipase 16 11 - 51 U/L  Comprehensive metabolic panel     Status: Abnormal   Collection Time: 02/18/17  7:51 PM  Result Value Ref Range   Sodium 137 135 - 145 mmol/L   Potassium 4.0 3.5 - 5.1 mmol/L   Chloride 103 101 - 111 mmol/L   CO2 23 22 - 32 mmol/L   Glucose, Bld 138 (H) 65 - 99  mg/dL   BUN 12 6 - 20 mg/dL   Creatinine, Ser 1.06 0.61 - 1.24 mg/dL   Calcium 9.1 8.9 - 10.3 mg/dL   Total Protein 7.8 6.5 - 8.1 g/dL   Albumin 4.1 3.5 - 5.0 g/dL   AST 21 15 - 41 U/L   ALT 21 17 - 63 U/L   Alkaline Phosphatase 93 38 - 126 U/L   Total Bilirubin 1.0 0.3 - 1.2 mg/dL   GFR calc non Af Amer >60 >60 mL/min   GFR calc Af Amer >60 >60 mL/min    Comment: (NOTE) The eGFR has been calculated using the CKD EPI equation. This calculation has not been validated in all clinical situations. eGFR's persistently <60 mL/min signify possible Chronic  Kidney Disease.    Anion gap 11 5 - 15  CBC     Status: Abnormal   Collection Time: 02/18/17  7:51 PM  Result Value Ref Range   WBC 17.8 (H) 4.0 - 10.5 K/uL   RBC 5.02 4.22 - 5.81 MIL/uL   Hemoglobin 11.7 (L) 13.0 - 17.0 g/dL   HCT 37.4 (L) 39.0 - 52.0 %   MCV 74.5 (L) 78.0 - 100.0 fL   MCH 23.3 (L) 26.0 - 34.0 pg   MCHC 31.3 30.0 - 36.0 g/dL   RDW 15.4 11.5 - 15.5 %   Platelets 345 150 - 400 K/uL  Urinalysis, Routine w reflex microscopic     Status: Abnormal   Collection Time: 02/18/17  7:53 PM  Result Value Ref Range   Color, Urine YELLOW YELLOW   APPearance CLEAR CLEAR   Specific Gravity, Urine 1.021 1.005 - 1.030   pH 7.0 5.0 - 8.0   Glucose, UA NEGATIVE NEGATIVE mg/dL   Hgb urine dipstick NEGATIVE NEGATIVE   Bilirubin Urine NEGATIVE NEGATIVE   Ketones, ur 5 (A) NEGATIVE mg/dL   Protein, ur NEGATIVE NEGATIVE mg/dL   Nitrite NEGATIVE NEGATIVE   Leukocytes, UA NEGATIVE NEGATIVE  HIV antibody (Routine Testing)     Status: None   Collection Time: 02/19/17  4:47 AM  Result Value Ref Range   HIV Screen 4th Generation wRfx Non Reactive Non Reactive    Comment: (NOTE) Performed At: [facility] [address] [place], [place] [id number] [place] William F MD Ph:[phone]   Basic metabolic panel     Status: Abnormal   Collection Time: 02/19/17  4:47 AM  Result Value Ref Range   Sodium 136 135 - 145 mmol/L   Potassium 3.7 3.5 - 5.1 mmol/L   Chloride 104 101 - 111 mmol/L   CO2 23 22 - 32 mmol/L   Glucose, Bld 126 (H) 65 - 99 mg/dL   BUN 10 6 - 20 mg/dL   Creatinine, Ser 1.01 0.61 - 1.24 mg/dL   Calcium 8.4 (L) 8.9 - 10.3 mg/dL   GFR calc non Af Amer >60 >60 mL/min   GFR calc Af Amer >60 >60 mL/min    Comment: (NOTE) The eGFR has been calculated using the CKD EPI equation. This calculation has not been validated in all clinical situations. eGFR's persistently <60 mL/min signify possible Chronic Kidney Disease.    Anion gap 9 5 - 15  CBC     Status: Abnormal    Collection Time: 02/19/17  4:47 AM  Result Value Ref Range   WBC 16.3 (H) 4.0 - 10.5 K/uL   RBC 4.72 4.22 - 5.81 MIL/uL   Hemoglobin 11.2 (L) 13.0 - 17.0 g/dL   HCT 35.2 (L) 39.0 -   52.0 %   MCV 74.6 (L) 78.0 - 100.0 fL   MCH 23.7 (L) 26.0 - 34.0 pg   MCHC 31.8 30.0 - 36.0 g/dL   RDW 15.4 11.5 - 15.5 %   Platelets 315 150 - 400 K/uL  CBC     Status: Abnormal   Collection Time: 02/20/17  6:05 AM  Result Value Ref Range   WBC 12.9 (H) 4.0 - 10.5 K/uL   RBC 4.79 4.22 - 5.81 MIL/uL   Hemoglobin 11.1 (L) 13.0 - 17.0 g/dL   HCT 36.0 (L) 39.0 - 52.0 %   MCV 75.2 (L) 78.0 - 100.0 fL   MCH 23.2 (L) 26.0 - 34.0 pg   MCHC 30.8 30.0 - 36.0 g/dL   RDW 15.5 11.5 - 15.5 %   Platelets 304 150 - 400 K/uL  Basic metabolic panel     Status: Abnormal   Collection Time: 02/20/17  6:05 AM  Result Value Ref Range   Sodium 137 135 - 145 mmol/L   Potassium 4.3 3.5 - 5.1 mmol/L   Chloride 108 101 - 111 mmol/L   CO2 22 22 - 32 mmol/L   Glucose, Bld 87 65 - 99 mg/dL   BUN 11 6 - 20 mg/dL   Creatinine, Ser 0.95 0.61 - 1.24 mg/dL   Calcium 8.4 (L) 8.9 - 10.3 mg/dL   GFR calc non Af Amer >60 >60 mL/min   GFR calc Af Amer >60 >60 mL/min    Comment: (NOTE) The eGFR has been calculated using the CKD EPI equation. This calculation has not been validated in all clinical situations. eGFR's persistently <60 mL/min signify possible Chronic Kidney Disease.    Anion gap 7 5 - 15    Imaging / Studies: Ct Abdomen Pelvis W Contrast  Result Date: 02/19/2017 CLINICAL DATA:  30-year-old male with right lower quadrant abdominal pain. EXAM: CT ABDOMEN AND PELVIS WITH CONTRAST TECHNIQUE: Multidetector CT imaging of the abdomen and pelvis was performed using the standard protocol following bolus administration of intravenous contrast. CONTRAST:  100mL ISOVUE-300 IOPAMIDOL (ISOVUE-300) INJECTION 61% COMPARISON:  None. FINDINGS: Lower chest: The visualized lung bases are clear. Small amount of free fluid noted in the  right hemipelvis. Multiple tiny pockets of focally contained extraluminal air. Hepatobiliary: No focal liver abnormality is seen. No gallstones, gallbladder wall thickening, or biliary dilatation. Pancreas: Unremarkable. No pancreatic ductal dilatation or surrounding inflammatory changes. Spleen: Normal in size without focal abnormality. Adrenals/Urinary Tract: Adrenal glands are unremarkable. Kidneys are normal, without renal calculi, focal lesion, or hydronephrosis. Bladder is unremarkable. Stomach/Bowel: There are multiple sigmoid diverticula. There is segmental thickening and inflammatory changes of the sigmoid colon centered at a sigmoid diverticula. There is perisigmoid stranding with multiple small pockets of extraluminal air. Findings most consistent with sigmoid diverticulitis with focally contained microperforation. No drainable fluid collection or abscess. There is no evidence of bowel obstruction. Normal appendix. Vascular/Lymphatic: No significant vascular findings are present. No enlarged abdominal or pelvic lymph nodes. Reproductive: The prostate and seminal vesicles are grossly unremarkable. Other: None Musculoskeletal: No acute or significant osseous findings. IMPRESSION: Sigmoid diverticulitis with focally contained microperforations. No abscess. Electronically Signed   By: Arash  Radparvar M.D.   On: 02/19/2017 00:48    Medications / Allergies: per chart  Antibiotics: Anti-infectives    Start     Dose/Rate Route Frequency Ordered Stop   02/19/17 1000  piperacillin-tazobactam (ZOSYN) IVPB 3.375 g     3.375 g 12.5 mL/hr over 240 Minutes Intravenous Every 8 hours   02/19/17 0428     02/19/17 0130  piperacillin-tazobactam (ZOSYN) IVPB 3.375 g     3.375 g 100 mL/hr over 30 Minutes Intravenous  Once 02/19/17 0122 02/19/17 0216        Note: Portions of this report may have been transcribed using voice recognition software. Every effort was made to ensure accuracy; however, inadvertent  computerized transcription errors may be present.   Any transcriptional errors that result from this process are unintentional.     Steven C. Gross, M.D., F.A.C.S. Gastrointestinal and Minimally Invasive Surgery [facility] [address] [place], [place] [address] [phone] Main / Paging   02/20/2017   

## 2017-02-20 NOTE — Progress Notes (Signed)
Pt. Ambulated in hall x 2 around unit.

## 2017-02-21 LAB — CBC
HEMATOCRIT: 36.6 % — AB (ref 39.0–52.0)
Hemoglobin: 11.3 g/dL — ABNORMAL LOW (ref 13.0–17.0)
MCH: 23.4 pg — ABNORMAL LOW (ref 26.0–34.0)
MCHC: 30.9 g/dL (ref 30.0–36.0)
MCV: 75.8 fL — AB (ref 78.0–100.0)
PLATELETS: 337 10*3/uL (ref 150–400)
RBC: 4.83 MIL/uL (ref 4.22–5.81)
RDW: 15.9 % — ABNORMAL HIGH (ref 11.5–15.5)
WBC: 11.7 10*3/uL — AB (ref 4.0–10.5)

## 2017-02-21 MED ORDER — HYDROCODONE-ACETAMINOPHEN 5-325 MG PO TABS: 1.0000 | ORAL_TABLET | ORAL | Status: DC | PRN

## 2017-02-21 NOTE — Progress Notes (Signed)
Progress Note: General Surgery Service   Chief Complaint: Abdominal Pain  Subjective: Pain much improved, ambulating, tolerated liquid  Objective: Vital signs in last 24 hours: Temp:  [98 F (36.7 C)-99.3 F (37.4 C)] 98 F (36.7 C) (04/29 0448) Pulse Rate:  [105-107] 107 (04/29 0448) Resp:  [18] 18 (04/29 0448) BP: (126-136)/(77-98) 129/98 (04/29 0448) SpO2:  [100 %] 100 % (04/29 0448) Last BM Date: 02/20/17  Intake/Output from previous day: 04/28 0701 - 04/29 0700 In: 4769 [P.O.:954; I.V.:3415; IV Piggyback:400] Out: -  Intake/Output this shift: No intake/output data recorded.  Lungs: CTAB  Cardiovascular: tachy  Abd: soft, NT, ND  Extremities: No edema, good strength  Neuro: AOx4  Lab Results: CBC   Recent Labs  02/20/17 0605 02/21/17 0433  WBC 12.9* 11.7*  HGB 11.1* 11.3*  HCT 36.0* 36.6*  PLT 304 337   BMET  Recent Labs  02/19/17 0447 02/20/17 0605  NA 136 137  K 3.7 4.3  CL 104 108  CO2 23 22  GLUCOSE 126* 87  BUN 10 11  CREATININE 1.01 0.95  CALCIUM 8.4* 8.4*   PT/INR No results for input(s): LABPROT, INR in the last 72 hours. ABG No results for input(s): PHART, HCO3 in the last 72 hours.  Invalid input(s): PCO2, PO2  Studies/Results:  Anti-infectives: Anti-infectives    Start     Dose/Rate Route Frequency Ordered Stop   02/19/17 1000  piperacillin-tazobactam (ZOSYN) IVPB 3.375 g     3.375 g 12.5 mL/hr over 240 Minutes Intravenous Every 8 hours 02/19/17 0428     02/19/17 0130  piperacillin-tazobactam (ZOSYN) IVPB 3.375 g     3.375 g 100 mL/hr over 30 Minutes Intravenous  Once 02/19/17 0122 02/19/17 0216      Medications: Scheduled Meds: . enoxaparin (LOVENOX) injection  40 mg Subcutaneous Q24H   Continuous Infusions: . sodium chloride 50 mL/hr at 02/21/17 0116  . piperacillin-tazobactam (ZOSYN)  IV 3.375 g (02/21/17 0636)   PRN Meds:.acetaminophen **OR** acetaminophen, HYDROmorphone (DILAUDID) injection, ketorolac,  metoprolol, metoprolol tartrate, ondansetron **OR** ondansetron (ZOFRAN) IV, ondansetron  Assessment/Plan: Patient Active Problem List   Diagnosis Date Noted  . Uses Spanish as primary spoken language 02/20/2017  . Sinus tachycardia 02/20/2017  . Hypertension   . Sigmoid diverticulitis with small perforatrion 02/19/2017  s/p   -advance diet -continue pain control -ambulate -IV abx today -am labs     LOS: 2 days   Rodman Pickle, MD Pg# [phone] [facility]

## 2017-02-22 LAB — CBC
HCT: 34.8 % — ABNORMAL LOW (ref 39.0–52.0)
HEMOGLOBIN: 11 g/dL — AB (ref 13.0–17.0)
MCH: 23.7 pg — ABNORMAL LOW (ref 26.0–34.0)
MCHC: 31.6 g/dL (ref 30.0–36.0)
MCV: 75 fL — AB (ref 78.0–100.0)
Platelets: 360 10*3/uL (ref 150–400)
RBC: 4.64 MIL/uL (ref 4.22–5.81)
RDW: 15.6 % — ABNORMAL HIGH (ref 11.5–15.5)
WBC: 12.3 10*3/uL — AB (ref 4.0–10.5)

## 2017-02-22 MED ORDER — SACCHAROMYCES BOULARDII 250 MG PO CAPS: ORAL_CAPSULE | ORAL | Status: DC

## 2017-02-22 MED ORDER — AMOXICILLIN-POT CLAVULANATE 875-125 MG PO TABS: 1.0000 | ORAL_TABLET | Freq: Two times a day (BID) | ORAL | 0 refills | Status: DC

## 2017-02-22 MED ORDER — AMOXICILLIN-POT CLAVULANATE 875-125 MG PO TABS: 1.0000 | ORAL_TABLET | Freq: Two times a day (BID) | ORAL | Status: DC

## 2017-02-22 MED ORDER — SACCHAROMYCES BOULARDII 250 MG PO CAPS: 250.0000 mg | ORAL_CAPSULE | Freq: Two times a day (BID) | ORAL | Status: DC

## 2017-02-22 NOTE — Discharge Instructions (Signed)
Diverticulitis (Diverticulitis) La diverticulitis ocurre cuando pequeos bolsillos que se han formado en el colon (intestino grueso) se infectan o se inflaman. CUIDADOS EN EL HOGAR  Siga las indicaciones del mdico.  Siga una dieta especial si el mdico se lo indic.  Cuando se sienta mejor, el mdico puede indicarle que cambie la dieta. Tal vez le indiquen que coma gran cantidad de [place]. Las frutas y los vegetales son buenas fuentes de [place]. La fibra facilita la evacuacin intestinal (defecacin).  Tome los suplementos o los probiticos como le indic el mdico.  Tome los medicamentos solamente como se lo haya indicado el mdico.  Cumpla con todas las visitas de control con su mdico. SOLICITE AYUDA SI:  El dolor no mejora.  Le resulta difcil alimentarse.  No defeca como lo hace normalmente. SOLICITE AYUDA DE INMEDIATO SI:  El dolor empeora.  Los problemas no mejoran.  Los problemas empeoran repentinamente.  Tiene fiebre.  No deja de vomitar.  La materia fecal (heces) es sanguinolenta o negra, de aspecto alquitranado. ASEGRESE DE QUE:  Comprende estas instrucciones.  Controlar su afeccin.  Recibir ayuda de inmediato si no mejora o si empeora. Esta informacin no tiene Theme park manager el consejo del mdico. Asegrese de hacerle al mdico cualquier pregunta que tenga. Document Released: 10/01/2011 Document Revised: 10/17/2013 Document Reviewed: 09/06/2013 Elsevier Interactive Patient Education  2017 Elsevier Inc.  Diverticulitis Diverticulitis is when small pockets in your large intestine (colon) get infected or swollen. This causes stomach pain and watery poop (diarrhea). These pouches are called diverticula. They form in people who have a condition called diverticulosis. Follow these instructions at home: Medicines   Take over-the-counter and prescription medicines only as told by your doctor. These include:  Antibiotics.  Pain medicines.  Fiber  pills.  Probiotics.  Stool softeners.  Do not drive or use heavy machinery while taking prescription pain medicine.  If you were prescribed an antibiotic, take it as told. Do not stop taking it even if you feel better. General instructions   Follow a diet as told by your doctor.  When you feel better, your doctor may tell you to change your diet. You may need to eat a lot of fiber. Fiber makes it easier to poop (have bowel movements). Healthy foods with fiber include:  Berries.  Beans.  Lentils.  Green vegetables.  Exercise 3 or more times a week. Aim for 30 minutes each time. Exercise enough to sweat and make your heart beat faster.  Keep all follow-up visits as told. This is important. You may need to have an exam of the large intestine. This is called a colonoscopy. Contact a doctor if:  Your pain does not get better.  You have a hard time eating or drinking.  You are not pooping like normal. Get help right away if:  Your pain gets worse.  Your problems do not get better.  Your problems get worse very fast.  You have a fever.  You throw up (vomit) more than one time.  You have poop that is:  Bloody.  Black.  [place]. Summary  Diverticulitis is when small pockets in your large intestine (colon) get infected or swollen.  Take medicines only as told by your doctor.  Follow a diet as told by your doctor. This information is not intended to replace advice given to you by your health care provider. Make sure you discuss any questions you have with your health care provider. Document Released: 03/30/2008 Document Revised: 10/29/2016 Document Reviewed:  10/29/2016 Elsevier Interactive Patient Education  2017 [employer].  If you have recurrent fever, abdominal pain, nausea,  vomiting, trouble voiding; contact your primary care physician or return to the emergency department for further evaluation.

## 2017-02-22 NOTE — Progress Notes (Signed)
  Subjective: He feels fine this a.m. tolerating soft diet no abdominal pain.  Objective: Vital signs in last 24 hours: Temp:  [98.2 F (36.8 C)-99.3 F (37.4 C)] 98.2 F (36.8 C) (04/30 0629) Pulse Rate:  [93-105] 93 (04/30 0629) Resp:  [18] 18 (04/30 0629) BP: (108-141)/(50-81) 108/65 (04/30 0629) SpO2:  [100 %] 100 % (04/30 0629) Last BM Date: 02/21/17 240 PO recorded Voided x 5 Stool x 3 Afebrile, VSS WBC still 12.3 - H/H stable  Intake/Output from previous day: 04/29 0701 - 04/30 0700 In: 240 [P.O.:240] Out: -  Intake/Output this shift: No intake/output data recorded.  General appearance: alert, cooperative and no distress Resp: clear to auscultation bilaterally GI: soft, non-tender; bowel sounds normal; no masses,  no organomegaly  Lab Results:   Recent Labs  02/21/17 0433 02/22/17 0406  WBC 11.7* 12.3*  HGB 11.3* 11.0*  HCT 36.6* 34.8*  PLT 337 360    BMET  Recent Labs  02/20/17 0605  NA 137  K 4.3  CL 108  CO2 22  GLUCOSE 87  BUN 11  CREATININE 0.95  CALCIUM 8.4*   PT/INR No results for input(s): LABPROT, INR in the last 72 hours.   Recent Labs Lab 02/18/17 1951  AST 21  ALT 21  ALKPHOS 93  BILITOT 1.0  PROT 7.8  ALBUMIN 4.1     Lipase     Component Value Date/Time   LIPASE 16 02/18/2017 1951     Studies/Results: No results found. Prior to Admission medications   Medication Sig Start Date End Date Taking? Authorizing Provider  acetaminophen (TYLENOL) 325 MG tablet Take 650 mg by mouth every 6 (six) hours as needed for mild pain.   Yes Historical Provider, MD    Medications: . enoxaparin (LOVENOX) injection  40 mg Subcutaneous Q24H   . sodium chloride 50 mL/hr at 02/21/17 2309  . piperacillin-tazobactam (ZOSYN)  IV 3.375 g (02/22/17 0545)    Assessment/Plan Diverticulitis with micro perforation FEN;  IVfluids/Cardiac diet ID:  Zosyn 4/26 =>> day 4 completed   DVT:  Lovenox   Plan: Home today on 7 more days of  Augmentin and a probiotic. I discussed this with his wife. She is going to arrange follow-up with a primary care physician next week. If he has further problems I recommended they come back to the emergency department for evaluation. He would need labs and a repeat CT scan point. Hopefully this is his first episode and will resolve without issue.     LOS: 3 days    Latrica Clowers 02/22/2017 [phone]

## 2017-03-12 ENCOUNTER — Ambulatory Visit (INDEPENDENT_AMBULATORY_CARE_PROVIDER_SITE_OTHER): Payer: Self-pay | Admitting: Family

## 2017-03-12 ENCOUNTER — Encounter: Payer: Self-pay | Admitting: Family

## 2017-03-12 ENCOUNTER — Other Ambulatory Visit (INDEPENDENT_AMBULATORY_CARE_PROVIDER_SITE_OTHER): Payer: Self-pay

## 2017-03-12 VITALS — BP 136/86 | HR 68 | Temp 98.4°F | Resp 16 | Ht 68.0 in | Wt 216.0 lb

## 2017-03-12 DIAGNOSIS — Z6832 Body mass index (BMI) 32.0-32.9, adult: Secondary | ICD-10-CM

## 2017-03-12 DIAGNOSIS — K5720 Diverticulitis of large intestine with perforation and abscess without bleeding: Secondary | ICD-10-CM

## 2017-03-12 DIAGNOSIS — I10 Essential (primary) hypertension: Secondary | ICD-10-CM

## 2017-03-12 DIAGNOSIS — E6609 Other obesity due to excess calories: Secondary | ICD-10-CM

## 2017-03-12 DIAGNOSIS — E669 Obesity, unspecified: Secondary | ICD-10-CM | POA: Insufficient documentation

## 2017-03-12 LAB — CBC WITH DIFFERENTIAL/PLATELET
Basophils Absolute: 0 10*3/uL (ref 0.0–0.1)
Basophils Relative: 0.6 % (ref 0.0–3.0)
EOS ABS: 0.1 10*3/uL (ref 0.0–0.7)
Eosinophils Relative: 2.7 % (ref 0.0–5.0)
HCT: 38.1 % — ABNORMAL LOW (ref 39.0–52.0)
Hemoglobin: 12.1 g/dL — ABNORMAL LOW (ref 13.0–17.0)
LYMPHS ABS: 1.9 10*3/uL (ref 0.7–4.0)
Lymphocytes Relative: 35.8 % (ref 12.0–46.0)
MCHC: 31.8 g/dL (ref 30.0–36.0)
MCV: 73.5 fl — ABNORMAL LOW (ref 78.0–100.0)
MONO ABS: 0.5 10*3/uL (ref 0.1–1.0)
Monocytes Relative: 10.1 % (ref 3.0–12.0)
NEUTROS PCT: 50.8 % (ref 43.0–77.0)
Neutro Abs: 2.6 10*3/uL (ref 1.4–7.7)
Platelets: 399 10*3/uL (ref 150.0–400.0)
RBC: 5.19 Mil/uL (ref 4.22–5.81)
RDW: 16.9 % — ABNORMAL HIGH (ref 11.5–15.5)
WBC: 5.2 10*3/uL (ref 4.0–10.5)

## 2017-03-12 NOTE — Patient Instructions (Addendum)
Thank you for choosing [facility].  SUMMARY AND INSTRUCTIONS:  Recommend a probiotic such as culturelle or Align.  See diverticulitis information below.   Follow up for a physical/annual exam at your convenience.    Labs:  Please stop by the lab on the lower level of the building for your blood work. Your results will be released to MyChart (or called to you) after review, usually within 72 hours after test completion. If any changes need to be made, you will be notified at that same time.  1.) The lab is open from 7:30am to 5:30 pm Monday-Friday 2.) No appointment is necessary 3.) Fasting (if needed) is 6-8 hours after food and drink; black coffee and water are okay    Follow up:  If your symptoms worsen or fail to improve, please contact our office for further instruction, or in case of emergency go directly to the emergency room at the closest medical facility.     Diverticulitis (Diverticulitis) La diverticulitis ocurre cuando pequeos bolsillos que se han formado en el colon (intestino grueso) se infectan o se inflaman. CUIDADOS EN EL HOGAR  Siga las indicaciones del mdico.  Siga una dieta especial si el mdico se lo indic.  Cuando se sienta mejor, el mdico puede indicarle que cambie la dieta. Tal vez le indiquen que coma gran cantidad de Brodheadfibra. Las frutas y los vegetales son buenas fuentes de Whitetailfibra. La fibra facilita la evacuacin intestinal (defecacin).  Tome los suplementos o los probiticos como le indic el mdico.  Tome los medicamentos solamente como se lo haya indicado el mdico.  Cumpla con todas las visitas de control con su mdico. SOLICITE AYUDA SI:  El dolor no mejora.  Le resulta difcil alimentarse.  No defeca como lo hace normalmente. SOLICITE AYUDA DE INMEDIATO SI:  El dolor empeora.  Los problemas no mejoran.  Los problemas empeoran repentinamente.  Tiene fiebre.  No deja de vomitar.  La materia fecal (heces) es  sanguinolenta o negra, de aspecto alquitranado. ASEGRESE DE QUE:  Comprende estas instrucciones.  Controlar su afeccin.  Recibir ayuda de inmediato si no mejora o si empeora. Esta informacin no tiene Theme park managercomo fin reemplazar el consejo del mdico. Asegrese de hacerle al mdico cualquier pregunta que tenga. Document Released: 10/01/2011 Document Revised: 10/17/2013 Document Reviewed: 09/06/2013 Elsevier Interactive Patient Education  2017 [employer]

## 2017-03-12 NOTE — Assessment & Plan Note (Signed)
Adequately controlled with current lifestyle management. Encouraged to follow a low sodium diet.

## 2017-03-12 NOTE — Assessment & Plan Note (Signed)
BMI of 32. Recommend weight loss of 5-10% of current body weight. Recommend increasing physical activity to 30 minutes of moderate level activity daily. Encourage nutritional intake that focuses on nutrient dense foods and is moderate, varied, and balanced and is low in saturated fats and processed/sugary foods. Continue to monitor.  

## 2017-03-12 NOTE — Assessment & Plan Note (Signed)
Sigmoid divirticulitis appears resolved with completion of Augmentin and only occasional lower abdominal pain. Tolerating food well. Discussed diverticulitis and prevention. Obtain CBC w/ diff. Follow up if symptoms return.

## 2017-03-12 NOTE — Progress Notes (Signed)
Subjective:    Patient ID: Robert Chen, male    DOB: 01/07/1986, 31 y.o.   MRN: [id number]  Chief Complaint  Patient presents with  . Establish Care    hospital follow up, needs CBC to check WBCs    HPI:  Robert Chen is a 31 y.o. male who  has a past medical history of Diverticulitis and Hypertension. and presents today for an office visit to establish care and hospital followed.   Lower abdominal pain - Recently evaluated in the ED and admitted to the hospital with crampy, intermittent abdominal pain. Described as worsened with movement and improves with lying down. Endorsed nausea with 2 episodes of vomiting. Physical exam with tenderness of the right lower quadrant, suprapubic region and left lower quadrant. White blood cell count was elevated at 17.8 and hemoglobin slightly low at 11.7. He did spike a fever was tachycardic. CT scan showed sigmoid diverticulitis with focally contained microperforations and no abscess. He is placed on bowel rest and started on IV antibiotics. General surgery recommended discharge home with antibiotics. He was discharged with Augmentin and a probiotic and instructed to follow-up with primary care and would need a repeat CT scan if labs if symptoms return. All hospital records, labs, and imaging were reviewed in detail.  Since leaving the hospital he reports taking the Augmentin as prescribed and denies adverse side effects. Symptoms improved since completion of the Augmentin. Does occasional have lower abdominal pains. No fevers. No further vomiting or nausa. Denies constipation, diarrhea or blood in his stool. Tolerating eating and drinking a regular diet.     No Known Allergies    Outpatient Medications Prior to Visit  Medication Sig Dispense Refill  . acetaminophen (TYLENOL) 325 MG tablet Take 650 mg by mouth every 6 (six) hours as needed for mild pain.    Marland Kitchen. amoxicillin-clavulanate (AUGMENTIN) 875-125 MG tablet Take 1 tablet by mouth every 12  (twelve) hours. 14 tablet 0  . saccharomyces boulardii (FLORASTOR) 250 MG capsule You can buy this at any drug store over the counter.  Follow directions and complete 1 package.     No facility-administered medications prior to visit.      Past Medical History:  Diagnosis Date  . Diverticulitis   . Hypertension       Past Surgical History:  Procedure Laterality Date  . NO PAST SURGERIES        Family History  Problem Relation Age of Onset  . Diabetes Father       Social History   Social History  . Marital status: Married    Spouse name: N/A  . Number of children: 1  . Years of education: N/A   Occupational History  . Construction    Social History Main Topics  . Smoking status: Never Smoker  . Smokeless tobacco: Never Used  . Alcohol use No  . Drug use: No  . Sexual activity: Not on file   Other Topics Concern  . Not on file   Social History Narrative   Fun/Hobby: Soccer      Review of Systems  Constitutional: Negative for chills and fever.  Respiratory: Negative for chest tightness and shortness of breath.   Cardiovascular: Negative for chest pain, palpitations and leg swelling.  Gastrointestinal: Negative for abdominal distention, abdominal pain, anal bleeding, blood in stool, constipation, diarrhea, nausea and vomiting.  Neurological: Negative for dizziness, weakness and light-headedness.       Objective:    BP 136/86 (BP Location: Left  Arm, Patient Position: Sitting, Cuff Size: Large)   Pulse 68   Temp 98.4 F (36.9 C) (Oral)   Resp 16   Ht 5\' 8"  (1.727 m)   Wt 216 lb (98 kg)   SpO2 98%   BMI 32.84 kg/m  Nursing note and vital signs reviewed.  Physical Exam  Constitutional: He is oriented to person, place, and time. He appears well-developed and well-nourished. No distress.  Cardiovascular: Normal rate, regular rhythm, normal heart sounds and intact distal pulses.   Pulmonary/Chest: Effort normal and breath sounds normal.    Abdominal: Normal appearance and bowel sounds are normal. He exhibits no mass. There is no hepatosplenomegaly. There is no tenderness. There is no rigidity, no rebound, no guarding, no tenderness at McBurney's point and negative Murphy's sign.  Neurological: He is alert and oriented to person, place, and time.  Skin: Skin is warm and dry.  Psychiatric: He has a normal mood and affect. His behavior is normal. Judgment and thought content normal.        Assessment & Plan:   Problem List Items Addressed This Visit      Cardiovascular and Mediastinum   Hypertension    Adequately controlled with current lifestyle management. Encouraged to follow a low sodium diet.         Digestive   Sigmoid diverticulitis with small perforatrion - Primary    Sigmoid divirticulitis appears resolved with completion of Augmentin and only occasional lower abdominal pain. Tolerating food well. Discussed diverticulitis and prevention. Obtain CBC w/ diff. Follow up if symptoms return.       Relevant Orders   CBC w/Diff     Other   Obesity    BMI of 32. Recommend weight loss of 5-10% of current body weight. Recommend increasing physical activity to 30 minutes of moderate level activity daily. Encourage nutritional intake that focuses on nutrient dense foods and is moderate, varied, and balanced and is low in saturated fats and processed/sugary foods. Continue to monitor.           I have discontinued Mr. Repinski acetaminophen, amoxicillin-clavulanate, and saccharomyces boulardii.   Follow-up: Return in about 6 months (around 09/12/2017), or if symptoms worsen or fail to improve.  Jeanine Luz, FNP

## 2017-03-15 ENCOUNTER — Encounter: Payer: Self-pay | Admitting: Family

## 2017-09-03 ENCOUNTER — Ambulatory Visit: Payer: Self-pay | Admitting: Family Medicine

## 2017-09-06 ENCOUNTER — Ambulatory Visit: Payer: Self-pay | Admitting: Nurse Practitioner

## 2017-09-07 ENCOUNTER — Encounter: Payer: Self-pay | Admitting: Nurse Practitioner

## 2017-09-07 ENCOUNTER — Telehealth: Payer: Self-pay | Admitting: Nurse Practitioner

## 2017-09-07 ENCOUNTER — Other Ambulatory Visit (HOSPITAL_COMMUNITY)
Admission: RE | Admit: 2017-09-07 | Discharge: 2017-09-07 | Disposition: A | Discharge status: Home / Self Care | Payer: Self-pay | Source: Ambulatory Visit | Attending: Nurse Practitioner | Admitting: Nurse Practitioner

## 2017-09-07 ENCOUNTER — Other Ambulatory Visit: Payer: Self-pay

## 2017-09-07 ENCOUNTER — Ambulatory Visit (INDEPENDENT_AMBULATORY_CARE_PROVIDER_SITE_OTHER): Payer: Self-pay | Admitting: Nurse Practitioner

## 2017-09-07 VITALS — BP 140/100 | HR 84 | Temp 98.1°F | Ht 68.0 in | Wt 219.0 lb

## 2017-09-07 DIAGNOSIS — R300 Dysuria: Secondary | ICD-10-CM

## 2017-09-07 DIAGNOSIS — N5082 Scrotal pain: Secondary | ICD-10-CM

## 2017-09-07 LAB — POCT URINALYSIS DIPSTICK
BILIRUBIN UA: NEGATIVE
GLUCOSE UA: NEGATIVE
Leukocytes, UA: NEGATIVE
NITRITE UA: NEGATIVE
PH UA: 7.5 (ref 5.0–8.0)
Protein, UA: 15
RBC UA: NEGATIVE
SPEC GRAV UA: 1.015 (ref 1.010–1.025)
UROBILINOGEN UA: 1 U/dL

## 2017-09-07 MED ORDER — DOXYCYCLINE HYCLATE 100 MG PO TABS: 100.0000 mg | ORAL_TABLET | Freq: Two times a day (BID) | ORAL | 0 refills | Status: AC

## 2017-09-07 NOTE — Patient Instructions (Addendum)
You urine has been sent for culture and STD screen.  I also sent oral antibiotics to start.  You will be contacted to schedule scrotal US.

## 2017-09-07 NOTE — Progress Notes (Signed)
Subjective:  Patient ID: Robert Chen, male    DOB: 1986/06/06  Age: 31 y.o. MRN: [id number]  CC: Abdominal Pain (pain near belly button area come down to groin area- going on for 1 mo---painful when urinate--1 day)   Dysuria   This is a new problem. The current episode started 1 to 4 weeks ago. The problem occurs every urination. The problem has been gradually worsening. The quality of the pain is described as burning. There has been no fever. He is sexually active. There is no history of pyelonephritis. Associated symptoms include frequency and urgency. Pertinent negatives include no chills, discharge, flank pain, hematuria, hesitancy or nausea. He has tried nothing for the symptoms.    No outpatient medications prior to visit.   No facility-administered medications prior to visit.     ROS See HPI  Objective:  BP (!) 140/100   Pulse 84   Temp 98.1 F (36.7 C)   Ht 5\' 8"  (1.727 m)   Wt 219 lb (99.3 kg)   SpO2 98%   BMI 33.30 kg/m   BP Readings from Last 3 Encounters:  09/07/17 (!) 140/100  03/12/17 136/86  02/22/17 108/65    Wt Readings from Last 3 Encounters:  09/07/17 219 lb (99.3 kg)  03/12/17 216 lb (98 kg)  02/18/17 220 lb (99.8 kg)    Physical Exam  Constitutional: He is oriented to person, place, and time. No distress.  Cardiovascular: Normal rate.  Pulmonary/Chest: Effort normal.  Abdominal: Soft. Bowel sounds are normal. He exhibits no distension. There is no tenderness. Hernia confirmed negative in the right inguinal area and confirmed negative in the left inguinal area.  Genitourinary: Cremasteric reflex is present. Right testis shows no mass, no swelling and no tenderness. Left testis shows tenderness. Left testis shows no mass and no swelling. Uncircumcised. No penile erythema or penile tenderness. No discharge found.  Lymphadenopathy:       Right: Inguinal adenopathy present.       Left: Inguinal adenopathy present.  Neurological: He is alert and  oriented to person, place, and time.  Vitals reviewed.   Lab Results  Component Value Date   WBC 5.2 03/12/2017   HGB 12.1 (L) 03/12/2017   HCT 38.1 (L) 03/12/2017   PLT 399.0 03/12/2017   GLUCOSE 87 02/20/2017   ALT 21 02/18/2017   AST 21 02/18/2017   NA 137 02/20/2017   K 4.3 02/20/2017   CL 108 02/20/2017   CREATININE 0.95 02/20/2017   BUN 11 02/20/2017   CO2 22 02/20/2017    Ct Abdomen Pelvis W Contrast  Result Date: 02/19/2017 CLINICAL DATA:  31 year old male with right lower quadrant abdominal pain. EXAM: CT ABDOMEN AND PELVIS WITH CONTRAST TECHNIQUE: Multidetector CT imaging of the abdomen and pelvis was performed using the standard protocol following bolus administration of intravenous contrast. CONTRAST:  100mL ISOVUE-300 IOPAMIDOL (ISOVUE-300) INJECTION 61% COMPARISON:  None. FINDINGS: Lower chest: The visualized lung bases are clear. Small amount of free fluid noted in the right hemipelvis. Multiple tiny pockets of focally contained extraluminal air. Hepatobiliary: No focal liver abnormality is seen. No gallstones, gallbladder wall thickening, or biliary dilatation. Pancreas: Unremarkable. No pancreatic ductal dilatation or surrounding inflammatory changes. Spleen: Normal in size without focal abnormality. Adrenals/Urinary Tract: Adrenal glands are unremarkable. Kidneys are normal, without renal calculi, focal lesion, or hydronephrosis. Bladder is unremarkable. Stomach/Bowel: There are multiple sigmoid diverticula. There is segmental thickening and inflammatory changes of the sigmoid colon centered at a sigmoid diverticula. There is  perisigmoid stranding with multiple small pockets of extraluminal air. Findings most consistent with sigmoid diverticulitis with focally contained microperforation. No drainable fluid collection or abscess. There is no evidence of bowel obstruction. Normal appendix. Vascular/Lymphatic: No significant vascular findings are present. No enlarged abdominal  or pelvic lymph nodes. Reproductive: The prostate and seminal vesicles are grossly unremarkable. Other: None Musculoskeletal: No acute or significant osseous findings. IMPRESSION: Sigmoid diverticulitis with focally contained microperforations. No abscess. Electronically Signed   By: Robert CollardArash  Chen M.D.   On: 02/19/2017 00:48    Assessment & Plan:   Robert Chen was seen today for abdominal pain.  Diagnoses and all orders for this visit:  Dysuria -     POCT urinalysis dipstick -     Urine Culture; Future -     Urine cytology ancillary only; Future -     US Scrotum; Future -     doxycycline (VIBRA-TABS) 100 MG tablet; Take 1 tablet (100 mg total) 2 (two) times daily for 7 days by mouth.  Scrotal pain -     Urine Culture; Future -     Urine cytology ancillary only; Future -     US Scrotum; Future -     doxycycline (VIBRA-TABS) 100 MG tablet; Take 1 tablet (100 mg total) 2 (two) times daily for 7 days by mouth.   [facility].  Meds ordered this encounter  Medications  . doxycycline (VIBRA-TABS) 100 MG tablet    Sig: Take 1 tablet (100 mg total) 2 (two) times daily for 7 days by mouth.    Dispense:  14 tablet    Refill:  0    Order Specific Question:   Supervising Provider    Answer:   Robert Chen, Robert Chen [[id number]]    Follow-up: No Follow-up on file.  Robert Pennaharlotte Robbie Nangle, NP

## 2017-09-07 NOTE — Telephone Encounter (Signed)
Per pt, after his visit, he wants all results/comminication to go through his wife Carver FilaMindy Kress [phone] (she is on his DPR).

## 2017-09-08 LAB — URINE CULTURE
MICRO NUMBER:: 81277786
Result:: NO GROWTH
SPECIMEN QUALITY: ADEQUATE

## 2017-09-09 ENCOUNTER — Encounter: Payer: Self-pay | Admitting: Nurse Practitioner

## 2017-09-09 LAB — URINE CYTOLOGY ANCILLARY ONLY
CHLAMYDIA, DNA PROBE: NEGATIVE
Neisseria Gonorrhea: NEGATIVE
TRICH (WINDOWPATH): NEGATIVE

## 2017-09-09 NOTE — Telephone Encounter (Signed)
Notify pt result though mychart.

## 2017-11-12 ENCOUNTER — Ambulatory Visit: Payer: Self-pay | Admitting: Nurse Practitioner

## 2017-12-28 ENCOUNTER — Other Ambulatory Visit (INDEPENDENT_AMBULATORY_CARE_PROVIDER_SITE_OTHER): Payer: Self-pay

## 2017-12-28 ENCOUNTER — Ambulatory Visit (INDEPENDENT_AMBULATORY_CARE_PROVIDER_SITE_OTHER): Payer: Self-pay | Admitting: Family Medicine

## 2017-12-28 ENCOUNTER — Telehealth: Payer: Self-pay | Admitting: Family Medicine

## 2017-12-28 ENCOUNTER — Encounter: Payer: Self-pay | Admitting: Family Medicine

## 2017-12-28 VITALS — BP 142/98 | HR 76 | Temp 98.2°F | Ht 68.0 in | Wt 206.0 lb

## 2017-12-28 DIAGNOSIS — R1031 Right lower quadrant pain: Secondary | ICD-10-CM | POA: Insufficient documentation

## 2017-12-28 LAB — URINALYSIS, ROUTINE W REFLEX MICROSCOPIC
Bilirubin Urine: NEGATIVE
Ketones, ur: NEGATIVE
Nitrite: NEGATIVE
PH: 6.5 (ref 5.0–8.0)
SPECIFIC GRAVITY, URINE: 1.02 (ref 1.000–1.030)
TOTAL PROTEIN, URINE-UPE24: NEGATIVE
UROBILINOGEN UA: 0.2 (ref 0.0–1.0)
Urine Glucose: NEGATIVE

## 2017-12-28 LAB — CBC WITH DIFFERENTIAL/PLATELET
BASOS ABS: 0 10*3/uL (ref 0.0–0.1)
BASOS PCT: 0.4 % (ref 0.0–3.0)
EOS ABS: 0.1 10*3/uL (ref 0.0–0.7)
Eosinophils Relative: 0.8 % (ref 0.0–5.0)
HEMATOCRIT: 41.9 % (ref 39.0–52.0)
HEMOGLOBIN: 13.4 g/dL (ref 13.0–17.0)
LYMPHS PCT: 29 % (ref 12.0–46.0)
Lymphs Abs: 1.8 10*3/uL (ref 0.7–4.0)
MCHC: 31.9 g/dL (ref 30.0–36.0)
MCV: 75.6 fl — ABNORMAL LOW (ref 78.0–100.0)
Monocytes Absolute: 0.4 10*3/uL (ref 0.1–1.0)
Monocytes Relative: 6.2 % (ref 3.0–12.0)
NEUTROS ABS: 4 10*3/uL (ref 1.4–7.7)
Neutrophils Relative %: 63.6 % (ref 43.0–77.0)
Platelets: 385 10*3/uL (ref 150.0–400.0)
RBC: 5.54 Mil/uL (ref 4.22–5.81)
RDW: 16.8 % — AB (ref 11.5–15.5)
WBC: 6.3 10*3/uL (ref 4.0–10.5)

## 2017-12-28 LAB — COMPREHENSIVE METABOLIC PANEL
ALBUMIN: 4.4 g/dL (ref 3.5–5.2)
ALK PHOS: 100 U/L (ref 39–117)
ALT: 16 U/L (ref 0–53)
AST: 15 U/L (ref 0–37)
BUN: 13 mg/dL (ref 6–23)
CALCIUM: 9.7 mg/dL (ref 8.4–10.5)
CO2: 27 mEq/L (ref 19–32)
Chloride: 105 mEq/L (ref 96–112)
Creatinine, Ser: 0.98 mg/dL (ref 0.40–1.50)
GFR: 93.18 mL/min (ref >60.00)
Glucose, Bld: 101 mg/dL — ABNORMAL HIGH (ref 70–99)
Potassium: 4 mEq/L (ref 3.5–5.1)
SODIUM: 139 meq/L (ref 135–145)
TOTAL PROTEIN: 8.5 g/dL — AB (ref 6.0–8.3)
Total Bilirubin: 0.5 mg/dL (ref 0.2–1.2)

## 2017-12-28 LAB — C-REACTIVE PROTEIN: CRP: 1 mg/dL (ref 0.5–20.0)

## 2017-12-28 NOTE — Telephone Encounter (Signed)
Spoke with patient's wife about his lab results. Advised that if his symptoms continue then would need to obtain a CT ab/pelvis.   Myra RudeSchmitz, Jeremy E, MD [facility] 12/28/2017, 5:01 PM

## 2017-12-28 NOTE — Assessment & Plan Note (Signed)
Unclear if this is related to his history of diverticulitis. Does not appear to be an obstruction. Possible for appendicitis. No history of abdominal surgery. Possibility is urinary tract infection does not seem to be related to nephrolithiasis - CRP, CBC, CMP, urinalysis - Depending on the lab work is revealing for can monitor or may need to order a CT abdomen pelvis. Medius and then Cipro &Flagyl concerning for diverticulitis.

## 2017-12-28 NOTE — Patient Instructions (Signed)
We will call you with results from today. Please go to the hospital if you feels like your symptoms are increasingly getting worse or you are unable to have a bowel movement.  We may need to order a CT scan if your symptoms don't improve.

## 2017-12-28 NOTE — Progress Notes (Signed)
Robert Chen - 32 y.o. male MRN [id number]  Date of birth: 04-23-1986  SUBJECTIVE:  Including CC & ROS.  Chief Complaint  Patient presents with  . Abdominal Pain    Robert Chen is a 32 y.o. male that is presenting with abdominal pain and nausea. Symptoms have been present for one week. Pain is described as a constant stabbing, located in his lower abdominal quadrant. Denies food triggers.  Denies vomiting or fevers. Denies diarrhea and constipation. Admits to reflux. Admits to bloating. He was admitted in 2018 for diverticulitis. Having some pain with urination. He does not feel that the pain is changing with bowel movements. He is still having bowel movements. Denies any suggestion of sexual transmitted disease.  Review the CT abdomen and pelvis from 2018 shows sigmoid diverticulitis with focally contained microperforations and no abscess.   Review of Systems  Constitutional: Negative for fever.  Eyes: Negative for visual disturbance.  Respiratory: Negative for shortness of breath.   Cardiovascular: Negative for chest pain.  Gastrointestinal: Positive for abdominal pain.  Musculoskeletal: Negative for back pain.  Skin: Negative for color change.  Allergic/Immunologic: Negative for immunocompromised state.  Neurological: Negative for weakness.  Hematological: Negative for adenopathy.  Psychiatric/Behavioral: Negative for agitation.    HISTORY: Past Medical, Surgical, Social, and Family History Reviewed & Updated per EMR.   Pertinent Historical Findings include:  Past Medical History:  Diagnosis Date  . Diverticulitis   . Hypertension     Past Surgical History:  Procedure Laterality Date  . NO PAST SURGERIES      No Known Allergies  Family History  Problem Relation Age of Onset  . Diabetes Father      Social History   Socioeconomic History  . Marital status: Married    Spouse name: Not on file  . Number of children: 1  . Years of education: Not on file  .  Highest education level: Not on file  Social Needs  . Financial resource strain: Not on file  . Food insecurity - worry: Not on file  . Food insecurity - inability: Not on file  . Transportation needs - medical: Not on file  . Transportation needs - non-medical: Not on file  Occupational History  . Occupation: Holiday representative  Tobacco Use  . Smoking status: Never Smoker  . Smokeless tobacco: Never Used  Substance and Sexual Activity  . Alcohol use: No  . Drug use: No  . Sexual activity: Not on file  Other Topics Concern  . Not on file  Social History Narrative   Fun/Hobby: Soccer     PHYSICAL EXAM:  VS: BP (!) 142/98 (BP Location: Right Arm, Patient Position: Sitting, Cuff Size: Normal)   Pulse 76   Temp 98.2 F (36.8 C) (Oral)   Ht 5\' 8"  (1.727 m)   Wt 206 lb (93.4 kg)   SpO2 98%   BMI 31.32 kg/m  Physical Exam Gen: NAD, alert, cooperative with exam,  ENT: normal lips, normal nasal mucosa,  Eye: normal EOM, normal conjunctiva and lids CV:  no edema, +2 pedal pulses, S1-S2, regular rate and rhythm   Resp: no accessory muscle use, non-labored,  GI: no masses or tenderness, no hernia, soft, positive bowel sounds, no rebound, negative Rovsing's,  Skin: no rashes, no areas of induration  Neuro: normal tone, normal sensation to touch Psych:  normal insight, alert and oriented MSK: Normal gait, normal strength      ASSESSMENT & PLAN:   Right lower quadrant abdominal pain Unclear  if this is related to his history of diverticulitis. Does not appear to be an obstruction. Possible for appendicitis. No history of abdominal surgery. Possibility is urinary tract infection does not seem to be related to nephrolithiasis - CRP, CBC, CMP, urinalysis - Depending on the lab work is revealing for can monitor or may need to order a CT abdomen pelvis. Medius and then Cipro &Flagyl concerning for diverticulitis.

## 2018-01-04 ENCOUNTER — Ambulatory Visit (INDEPENDENT_AMBULATORY_CARE_PROVIDER_SITE_OTHER): Payer: Self-pay | Admitting: Family Medicine

## 2018-01-04 ENCOUNTER — Other Ambulatory Visit: Payer: Self-pay

## 2018-01-04 ENCOUNTER — Encounter (HOSPITAL_COMMUNITY): Payer: Self-pay | Admitting: *Deleted

## 2018-01-04 ENCOUNTER — Telehealth: Payer: Self-pay | Admitting: Family Medicine

## 2018-01-04 ENCOUNTER — Inpatient Hospital Stay (HOSPITAL_COMMUNITY)
Admission: EM | Admit: 2018-01-04 | Discharge: 2018-01-09 | DRG: 392 | Disposition: A | Discharge status: Home / Self Care | Payer: Self-pay | Attending: General Surgery | Admitting: General Surgery

## 2018-01-04 ENCOUNTER — Other Ambulatory Visit (INDEPENDENT_AMBULATORY_CARE_PROVIDER_SITE_OTHER): Payer: Self-pay

## 2018-01-04 ENCOUNTER — Encounter: Payer: Self-pay | Admitting: Family Medicine

## 2018-01-04 ENCOUNTER — Ambulatory Visit
Admission: RE | Admit: 2018-01-04 | Discharge: 2018-01-04 | Disposition: A | Discharge status: Home / Self Care | Payer: No Typology Code available for payment source | Source: Ambulatory Visit | Attending: Family Medicine | Admitting: Family Medicine

## 2018-01-04 VITALS — BP 142/94 | HR 86 | Temp 98.4°F | Wt 206.0 lb

## 2018-01-04 DIAGNOSIS — R300 Dysuria: Secondary | ICD-10-CM | POA: Insufficient documentation

## 2018-01-04 DIAGNOSIS — B950 Streptococcus, group A, as the cause of diseases classified elsewhere: Secondary | ICD-10-CM | POA: Diagnosis present

## 2018-01-04 DIAGNOSIS — R1031 Right lower quadrant pain: Secondary | ICD-10-CM

## 2018-01-04 DIAGNOSIS — K651 Peritoneal abscess: Secondary | ICD-10-CM

## 2018-01-04 DIAGNOSIS — K5720 Diverticulitis of large intestine with perforation and abscess without bleeding: Principal | ICD-10-CM | POA: Diagnosis present

## 2018-01-04 DIAGNOSIS — I10 Essential (primary) hypertension: Secondary | ICD-10-CM | POA: Diagnosis present

## 2018-01-04 DIAGNOSIS — R1030 Lower abdominal pain, unspecified: Secondary | ICD-10-CM

## 2018-01-04 DIAGNOSIS — N390 Urinary tract infection, site not specified: Secondary | ICD-10-CM | POA: Diagnosis present

## 2018-01-04 LAB — CBC WITH DIFFERENTIAL/PLATELET
Basophils Absolute: 0 10*3/uL (ref 0.0–0.1)
Basophils Relative: 0 %
Eosinophils Absolute: 0 10*3/uL (ref 0.0–0.7)
Eosinophils Relative: 0 %
HCT: 40 % (ref 39.0–52.0)
Hemoglobin: 12.8 g/dL — ABNORMAL LOW (ref 13.0–17.0)
Lymphocytes Relative: 18 %
Lymphs Abs: 2 10*3/uL (ref 0.7–4.0)
MCH: 24.5 pg — ABNORMAL LOW (ref 26.0–34.0)
MCHC: 32 g/dL (ref 30.0–36.0)
MCV: 76.6 fL — ABNORMAL LOW (ref 78.0–100.0)
Monocytes Absolute: 0.5 10*3/uL (ref 0.1–1.0)
Monocytes Relative: 4 %
Neutro Abs: 9 10*3/uL — ABNORMAL HIGH (ref 1.7–7.7)
Neutrophils Relative %: 78 %
Platelets: 356 10*3/uL (ref 150–400)
RBC: 5.22 MIL/uL (ref 4.22–5.81)
RDW: 15.9 % — ABNORMAL HIGH (ref 11.5–15.5)
WBC: 11.5 10*3/uL — ABNORMAL HIGH (ref 4.0–10.5)

## 2018-01-04 LAB — URINALYSIS, ROUTINE W REFLEX MICROSCOPIC
BILIRUBIN URINE: NEGATIVE
Ketones, ur: NEGATIVE
NITRITE: NEGATIVE
Specific Gravity, Urine: 1.025 (ref 1.000–1.030)
Urine Glucose: NEGATIVE
Urobilinogen, UA: 0.2 (ref 0.0–1.0)
pH: 7 (ref 5.0–8.0)

## 2018-01-04 LAB — COMPREHENSIVE METABOLIC PANEL
ALT: 18 U/L (ref 17–63)
AST: 17 U/L (ref 15–41)
Albumin: 4 g/dL (ref 3.5–5.0)
Alkaline Phosphatase: 95 U/L (ref 38–126)
Anion gap: 10 (ref 5–15)
BUN: 7 mg/dL (ref 6–20)
CO2: 23 mmol/L (ref 22–32)
Calcium: 9.2 mg/dL (ref 8.9–10.3)
Chloride: 104 mmol/L (ref 101–111)
Creatinine, Ser: 0.95 mg/dL (ref 0.61–1.24)
GFR calc Af Amer: >60 mL/min (ref >60)
GFR calc non Af Amer: >60 mL/min (ref >60)
Glucose, Bld: 101 mg/dL — ABNORMAL HIGH (ref 65–99)
Potassium: 3.9 mmol/L (ref 3.5–5.1)
Sodium: 137 mmol/L (ref 135–145)
Total Bilirubin: 0.8 mg/dL (ref 0.3–1.2)
Total Protein: 8.5 g/dL — ABNORMAL HIGH (ref 6.5–8.1)

## 2018-01-04 MED ORDER — ACETAMINOPHEN 325 MG PO TABS
650.0000 mg | ORAL_TABLET | Freq: Four times a day (QID) | ORAL | Status: DC | PRN
  Administered 2018-01-06 – 2018-01-07 (×2): 650 mg via ORAL
  Filled 2018-01-04 (×2): qty 2

## 2018-01-04 MED ORDER — ONDANSETRON HCL 4 MG/2ML IJ SOLN: 4.0000 mg | Freq: Four times a day (QID) | INTRAMUSCULAR | Status: DC | PRN

## 2018-01-04 MED ORDER — HYDROMORPHONE HCL 1 MG/ML IJ SOLN
0.5000 mg | INTRAMUSCULAR | Status: DC | PRN
  Administered 2018-01-05: 0.5 mg via INTRAVENOUS
  Filled 2018-01-04: qty 0.5

## 2018-01-04 MED ORDER — ENOXAPARIN SODIUM 40 MG/0.4ML ~~LOC~~ SOLN
40.0000 mg | SUBCUTANEOUS | Status: DC
  Administered 2018-01-05 – 2018-01-06 (×2): 40 mg via SUBCUTANEOUS
  Filled 2018-01-04 (×3): qty 0.4

## 2018-01-04 MED ORDER — SIMETHICONE 80 MG PO CHEW: 40.0000 mg | CHEWABLE_TABLET | Freq: Four times a day (QID) | ORAL | Status: DC | PRN

## 2018-01-04 MED ORDER — SODIUM CHLORIDE 0.9 % IV SOLN
INTRAVENOUS | Status: DC
  Administered 2018-01-04 – 2018-01-06 (×4): via INTRAVENOUS

## 2018-01-04 MED ORDER — ACETAMINOPHEN 650 MG RE SUPP: 650.0000 mg | Freq: Four times a day (QID) | RECTAL | Status: DC | PRN

## 2018-01-04 MED ORDER — PIPERACILLIN-TAZOBACTAM 3.375 G IVPB 30 MIN
3.3750 g | Freq: Once | INTRAVENOUS | Status: AC
  Administered 2018-01-04: 3.375 g via INTRAVENOUS
  Filled 2018-01-04: qty 50

## 2018-01-04 MED ORDER — PIPERACILLIN-TAZOBACTAM 3.375 G IVPB
3.3750 g | Freq: Three times a day (TID) | INTRAVENOUS | Status: DC
  Administered 2018-01-05 – 2018-01-07 (×8): 3.375 g via INTRAVENOUS
  Filled 2018-01-04 (×9): qty 50

## 2018-01-04 MED ORDER — IOPAMIDOL (ISOVUE-300) INJECTION 61%
125.0000 mL | Freq: Once | INTRAVENOUS | Status: AC | PRN
  Administered 2018-01-04: 125 mL via INTRAVENOUS

## 2018-01-04 MED ORDER — ONDANSETRON 4 MG PO TBDP: 4.0000 mg | ORAL_TABLET | Freq: Four times a day (QID) | ORAL | Status: DC | PRN

## 2018-01-04 NOTE — ED Triage Notes (Signed)
Pt in stating he had a CT scan today and was told he had an abscess in his stomach, sent for further evaluation

## 2018-01-04 NOTE — H&P (Signed)
Robert Chen is an 32 y.o. male.   Chief Complaint: abd pain HPI: 81yom with history of htn who had admission in 4/18 for sigmoid diverticulitis.  This resolved conservatively and he has done fine until the last couple weeks. He has had increasing abdominal pain over that time mostly in lower abdomen. He has been urinating frequently he still has bowel function. He also says urine has been dark. Denies n/v/fevers. He underwent outpt scan that shows sigmoid diverticular disease with abscess and larger area of phlegmon.  This is on the bladder which accounts for urinary symptoms I believe. No pneumaturia.    Past Medical History:  Diagnosis Date  . Diverticulitis   . Hypertension     Past Surgical History:  Procedure Laterality Date  . NO PAST SURGERIES      Family History  Problem Relation Age of Onset  . Diabetes Father    Social History:  reports that  has never smoked. he has never used smokeless tobacco. He reports that he does not drink alcohol or use drugs.  Allergies: No Known Allergies  Meds none  Results for orders placed or performed during the hospital encounter of 01/04/18 (from the past 48 hour(s))  CBC with Differential     Status: Abnormal   Collection Time: 01/04/18  1:51 PM  Result Value Ref Range   WBC 11.5 (H) 4.0 - 10.5 K/uL   RBC 5.22 4.22 - 5.81 MIL/uL   Hemoglobin 12.8 (L) 13.0 - 17.0 g/dL   HCT 40.0 39.0 - 52.0 %   MCV 76.6 (L) 78.0 - 100.0 fL   MCH 24.5 (L) 26.0 - 34.0 pg   MCHC 32.0 30.0 - 36.0 g/dL   RDW 15.9 (H) 11.5 - 15.5 %   Platelets 356 150 - 400 K/uL   Neutrophils Relative % 78 %   Neutro Abs 9.0 (H) 1.7 - 7.7 K/uL   Lymphocytes Relative 18 %   Lymphs Abs 2.0 0.7 - 4.0 K/uL   Monocytes Relative 4 %   Monocytes Absolute 0.5 0.1 - 1.0 K/uL   Eosinophils Relative 0 %   Eosinophils Absolute 0.0 0.0 - 0.7 K/uL   Basophils Relative 0 %   Basophils Absolute 0.0 0.0 - 0.1 K/uL    Comment: Performed at [facility], [address], [place], [place] [address]  Comprehensive metabolic panel     Status: Abnormal   Collection Time: 01/04/18  1:51 PM  Result Value Ref Range   Sodium 137 135 - 145 mmol/L   Potassium 3.9 3.5 - 5.1 mmol/L   Chloride 104 101 - 111 mmol/L   CO2 23 22 - 32 mmol/L   Glucose, Bld 101 (H) 65 - 99 mg/dL   BUN 7 6 - 20 mg/dL   Creatinine, Ser 0.95 0.61 - 1.24 mg/dL   Calcium 9.2 8.9 - 10.3 mg/dL   Total Protein 8.5 (H) 6.5 - 8.1 g/dL   Albumin 4.0 3.5 - 5.0 g/dL   AST 17 15 - 41 U/L   ALT 18 17 - 63 U/L   Alkaline Phosphatase 95 38 - 126 U/L   Total Bilirubin 0.8 0.3 - 1.2 mg/dL   GFR calc non Af Amer >60 >60 mL/min   GFR calc Af Amer >60 >60 mL/min    Comment: (NOTE) The eGFR has been calculated using the CKD EPI equation. This calculation has not been validated in all clinical situations. eGFR's persistently <60 mL/min signify possible Chronic Kidney Disease.  Anion gap 10 5 - 15    Comment: Performed at [facility] [address] [place], [place] [address]   Ct Abdomen Pelvis W Contrast  Result Date: 01/04/2018 CLINICAL DATA:  Intermittent lower abdominal pain for the past 2 weeks. 20 pound weight loss over the past year. History of diverticulitis. EXAM: CT ABDOMEN AND PELVIS WITH CONTRAST TECHNIQUE: Multidetector CT imaging of the abdomen and pelvis was performed using the standard protocol following bolus administration of intravenous contrast. CONTRAST:  157m ISOVUE-300 IOPAMIDOL (ISOVUE-300) INJECTION 61% COMPARISON:  CT abdomen and pelvis dated February 19, 2017. FINDINGS: Lower chest: No acute abnormality. Hepatobiliary: No focal liver abnormality is seen. No gallstones, gallbladder wall thickening, or biliary dilatation. Pancreas: Unremarkable. No pancreatic ductal dilatation or surrounding inflammatory changes. Spleen: Normal in size without focal abnormality. Adrenals/Urinary Tract: Adrenal glands are unremarkable. Kidneys are normal, without renal calculi, focal lesion, or  hydronephrosis. Prominent anterosuperior bladder wall thickening, likely reactive. Stomach/Bowel: Segmental wall thickening and inflammatory changes surrounding the mid sigmoid colon with adjacent phlegmonous change and abscess. The area phlegmonous change measures approximately 5.7 x 5.6 x 4.6 cm (AP by transverse by CC). The rim enhancing fluid collection containing air within the phlegmonous change measures 3.0 x 3.7 x 3.3 cm (AP by transverse by CC). Phlegmonous change is inseparable from the right anterior superior bladder wall. The remaining colon is unremarkable. Normal appendix. The stomach and small bowel are unremarkable. No bowel obstruction. Vascular/Lymphatic: No significant vascular findings are present. No enlarged abdominal or pelvic lymph nodes. Reproductive: Prostate is unremarkable. Other: Trace free fluid in the pelvis. No abdominal wall hernia or abnormality. Musculoskeletal: No acute or significant osseous findings. IMPRESSION: 1. Acute sigmoid diverticulitis complicated by adjacent phlegmonous change and 3.7 cm abscess, which are inseparable from the right anterior superior bladder wall. There is reactive bladder wall thickening, but no evidence of air within the bladder to suggest fistula formation. These results will be called to the ordering clinician or representative by the Radiologist Assistant, and communication documented in the PACS or zVision Dashboard. Electronically Signed   By: WTitus DubinM.D.   On: 01/04/2018 11:50    Review of Systems  Gastrointestinal: Positive for abdominal pain.  Genitourinary: Positive for frequency.  All other systems reviewed and are negative.   Blood pressure 138/89, pulse 98, temperature 99.5 F (37.5 C), temperature source Oral, resp. rate 18, SpO2 (!) 18 %. Physical Exam  Vitals reviewed. Constitutional: He is oriented to person, place, and time. He appears well-developed and well-nourished.  HENT:  Head: Normocephalic and  atraumatic.  Right Ear: External ear normal.  Left Ear: External ear normal.  Eyes: No scleral icterus.  Neck: Neck supple.  Cardiovascular: Normal rate, regular rhythm, normal heart sounds and intact distal pulses.  Respiratory: Effort normal and breath sounds normal.  GI: Bowel sounds are decreased. There is tenderness (bilateral lower quadrants).  Lymphadenopathy:    He has no cervical adenopathy.  Neurological: He is alert and oriented to person, place, and time.  Skin: Skin is warm and dry.  Psychiatric: His behavior is normal.     Assessment/Plan Diverticular abscess  Admit, iv abx per protocol, discuss with ir in am. Hopefully can avoid surgery acutely but need to consider due to mulitple episodes.  Discussed plan with patient and sister in law who is with him  MRolm Bookbinder MD 01/04/2018, 5:55 PM

## 2018-01-04 NOTE — Assessment & Plan Note (Signed)
He has a history of diverticulitis and was seen last week for right lower quadrant abdominal pain. This pain could be associated with urinary tract infection versus an underlying infection or abscess within his abdomen. Having some firmness to his abdomen on exam today. Having normal bowel movements so not likely for obstruction. Does not look septic at the moment. - Counseled that I would order a CT abdomen and pelvis stat. This will be to evaluate for infection, abscess, or obstruction. He is without insurance so counseled on his next steps.

## 2018-01-04 NOTE — Patient Instructions (Signed)
I will call you with the results from today and we will determine what to do after that.

## 2018-01-04 NOTE — Assessment & Plan Note (Signed)
His urine last week showed trace leukocytes and Hgb. It sounds like casts if what he is describing in his urine.  - UA and urine culture  - BMP

## 2018-01-04 NOTE — Progress Notes (Signed)
Robert Chen - 32 y.o. male MRN [id number]  Date of birth: 17-Apr-1986  SUBJECTIVE:  Including CC & ROS.  Chief Complaint  Patient presents with  . Dysuria    Pain when urinating; Pain in testicles; Symptoms x 1 week; Noticed brown/pink discharge after urination    Robert Chen is a 32 y.o. male that is following up from last week with abdominal pain. He reports the pain is more in the lower quadrant today. He is also having changes in urination with having brown discoloration in his urine. He has some discomfort in his testicles. He denies any injury or fevers. He feels like his abdomen has been more stiff than normal.He denies any nausea or vomiting. He has had normal bowel movements.he denies any groin pain or back pain. He denies any history of kidney stones..  Reviewed his blood work from 3/5 shows no white count, trace leukocytes in his urine, and normal liver enzymes   Review of Systems  Constitutional: Negative for fever.  HENT: Negative for congestion.   Eyes: Negative for photophobia.  Respiratory: Negative for cough.   Cardiovascular: Negative for chest pain.  Gastrointestinal: Positive for abdominal pain.  Genitourinary: Positive for dysuria. Negative for discharge and penile pain.  Musculoskeletal: Negative for arthralgias.  Skin: Negative for color change.  Allergic/Immunologic: Negative for immunocompromised state.  Neurological: Negative for weakness.  Hematological: Negative for adenopathy.  Psychiatric/Behavioral: Negative for agitation.    HISTORY: Past Medical, Surgical, Social, and Family History Reviewed & Updated per EMR.   Pertinent Historical Findings include:  Past Medical History:  Diagnosis Date  . Diverticulitis   . Hypertension     Past Surgical History:  Procedure Laterality Date  . NO PAST SURGERIES      No Known Allergies  Family History  Problem Relation Age of Onset  . Diabetes Father      Social History   Socioeconomic  History  . Marital status: Married    Spouse name: Not on file  . Number of children: 1  . Years of education: Not on file  . Highest education level: Not on file  Social Needs  . Financial resource strain: Not on file  . Food insecurity - worry: Not on file  . Food insecurity - inability: Not on file  . Transportation needs - medical: Not on file  . Transportation needs - non-medical: Not on file  Occupational History  . Occupation: Holiday representative[employer]  Tobacco Use  . Smoking status: Never Smoker  . Smokeless tobacco: Never Used  Substance and Sexual Activity  . Alcohol use: No  . Drug use: No  . Sexual activity: Not on file  Other Topics Concern  . Not on file  Social History Narrative   Fun/Hobby: Soccer     PHYSICAL EXAM:  VS: BP (!) 142/94 (BP Location: Right Arm, Patient Position: Sitting, Cuff Size: Normal)   Pulse 86   Temp 98.4 F (36.9 C) (Oral)   Wt 206 lb (93.4 kg)   SpO2 99%   BMI 31.32 kg/m  Physical Exam Gen: NAD, alert, cooperative with exam, ENT: normal lips, normal nasal mucosa,  Eye: normal EOM, normal conjunctiva and lids CV:  no edema, +2 pedal pulses   Resp: no accessory muscle use, non-labored,  GI: no masses, mild tenderness the lower quadrant, positive bowel sounds,abdomen has some firmness, no hernia  GU: no discharge from penis, no tenderness to palpation of the scrotum. No inguinal lymphadenopathy, no scrotal swelling, no redness Skin: no rashes,  no areas of induration  Neuro: normal tone, normal sensation to touch Psych:  normal insight, alert and oriented MSK: normal strength, normal gait       ASSESSMENT & PLAN:   Right lower quadrant abdominal pain This seems to have improved but now having pain in the lower quadrant. No fevers and normal bowel movement.    Dysuria His urine last week showed trace leukocytes and Hgb. It sounds like casts if what he is describing in his urine.  - UA and urine culture  - BMP   Lower abdominal  pain  He has a history of diverticulitis and was seen last week for right lower quadrant abdominal pain. This pain could be associated with urinary tract infection versus an underlying infection or abscess within his abdomen. Having some firmness to his abdomen on exam today. Having normal bowel movements so not likely for obstruction. Does not look septic at the moment. - Counseled that I would order a CT abdomen and pelvis stat. This will be to evaluate for infection, abscess, or obstruction. He is without insurance so counseled on his next steps.

## 2018-01-04 NOTE — ED Notes (Signed)
Lab called about pt's urine. Per Dr Patria Maneampos no labs needed for urine at this time

## 2018-01-04 NOTE — Telephone Encounter (Signed)
[facility] with a report for Dr. Clare GandyJeremy Schmitz for this pt of abd and pelvis.  I called the [place] office and they informed me he was at the [place] office today.   I called and spoke with Dr. Jordan LikesSchmitz there.   I read the CT results to him from Epic.    I let him know that the pt was still at the imaging center in case he needed further studies.   I gave Dr. Jordan LikesSchmitz the number and he is going to call them now.

## 2018-01-04 NOTE — Assessment & Plan Note (Signed)
This seems to have improved but now having pain in the lower quadrant. No fevers and normal bowel movement.

## 2018-01-04 NOTE — ED Provider Notes (Signed)
[facility] EMERGENCY DEPARTMENT Provider Note   CSN: [id number] Arrival date & time: 01/04/18  1240     History   Chief Complaint Chief Complaint  Patient presents with  . Abdominal Pain    HPI Robert Chen is a 32 y.o. male.  HPI 3 weeks of waxing and waning abdominal pain. Fever last week. Denies nausea vomiting and diarrhea. No blood in his stool. Hx of diverticulitis 1 year ago. Sent to the ER after outpatient CT demonstrated diverticulitis with associated abscess. Pain is mild at this time   Past Medical History:  Diagnosis Date  . Diverticulitis   . Hypertension     Patient Active Problem List   Diagnosis Date Noted  . Dysuria 01/04/2018  . Lower abdominal pain 01/04/2018  . Right lower quadrant abdominal pain 12/28/2017  . Obesity 03/12/2017  . Uses Spanish as primary spoken language 02/20/2017  . Sinus tachycardia 02/20/2017  . Hypertension   . Sigmoid diverticulitis with small perforatrion 02/19/2017    Past Surgical History:  Procedure Laterality Date  . NO PAST SURGERIES         Home Medications    Prior to Admission medications   Not on File    Family History Family History  Problem Relation Age of Onset  . Diabetes Father     Social History Social History   Tobacco Use  . Smoking status: Never Smoker  . Smokeless tobacco: Never Used  Substance Use Topics  . Alcohol use: No  . Drug use: No     Allergies   Patient has no known allergies.   Review of Systems Review of Systems  All other systems reviewed and are negative.    Physical Exam Updated Vital Signs BP 138/89   Pulse 98   Temp 99.5 F (37.5 C) (Oral)   Resp 18   SpO2 (!) 18%   Physical Exam  Constitutional: He is oriented to person, place, and time. He appears well-developed and well-nourished.  HENT:  Head: Normocephalic.  Eyes: EOM are normal.  Neck: Normal range of motion.  Cardiovascular: Normal rate.  Pulmonary/Chest: Effort  normal and breath sounds normal.  Abdominal: Soft. He exhibits no distension.  Lower abdominal tenderness without peritoneal signs  Musculoskeletal: Normal range of motion.  Neurological: He is alert and oriented to person, place, and time.  Psychiatric: He has a normal mood and affect.  Nursing note and vitals reviewed.    ED Treatments / Results  Labs (all labs ordered are listed, but only abnormal results are displayed) Labs Reviewed  CBC WITH DIFFERENTIAL/PLATELET - Abnormal; Notable for the following components:      Result Value   WBC 11.5 (*)    Hemoglobin 12.8 (*)    MCV 76.6 (*)    MCH 24.5 (*)    RDW 15.9 (*)    Neutro Abs 9.0 (*)    All other components within normal limits  COMPREHENSIVE METABOLIC PANEL - Abnormal; Notable for the following components:   Glucose, Bld 101 (*)    Total Protein 8.5 (*)    All other components within normal limits  CULTURE, BLOOD (ROUTINE X 2)  CULTURE, BLOOD (ROUTINE X 2)    EKG  EKG Interpretation None       Radiology Ct Abdomen Pelvis W Contrast  Result Date: 01/04/2018 CLINICAL DATA:  Intermittent lower abdominal pain for the past 2 weeks. 20 pound weight loss over the past year. History of diverticulitis. EXAM: CT ABDOMEN AND PELVIS  WITH CONTRAST TECHNIQUE: Multidetector CT imaging of the abdomen and pelvis was performed using the standard protocol following bolus administration of intravenous contrast. CONTRAST:  125mL ISOVUE-300 IOPAMIDOL (ISOVUE-300) INJECTION 61% COMPARISON:  CT abdomen and pelvis dated February 19, 2017. FINDINGS: Lower chest: No acute abnormality. Hepatobiliary: No focal liver abnormality is seen. No gallstones, gallbladder wall thickening, or biliary dilatation. Pancreas: Unremarkable. No pancreatic ductal dilatation or surrounding inflammatory changes. Spleen: Normal in size without focal abnormality. Adrenals/Urinary Tract: Adrenal glands are unremarkable. Kidneys are normal, without renal calculi, focal  lesion, or hydronephrosis. Prominent anterosuperior bladder wall thickening, likely reactive. Stomach/Bowel: Segmental wall thickening and inflammatory changes surrounding the mid sigmoid colon with adjacent phlegmonous change and abscess. The area phlegmonous change measures approximately 5.7 x 5.6 x 4.6 cm (AP by transverse by CC). The rim enhancing fluid collection containing air within the phlegmonous change measures 3.0 x 3.7 x 3.3 cm (AP by transverse by CC). Phlegmonous change is inseparable from the right anterior superior bladder wall. The remaining colon is unremarkable. Normal appendix. The stomach and small bowel are unremarkable. No bowel obstruction. Vascular/Lymphatic: No significant vascular findings are present. No enlarged abdominal or pelvic lymph nodes. Reproductive: Prostate is unremarkable. Other: Trace free fluid in the pelvis. No abdominal wall hernia or abnormality. Musculoskeletal: No acute or significant osseous findings. IMPRESSION: 1. Acute sigmoid diverticulitis complicated by adjacent phlegmonous change and 3.7 cm abscess, which are inseparable from the right anterior superior bladder wall. There is reactive bladder wall thickening, but no evidence of air within the bladder to suggest fistula formation. These results will be called to the ordering clinician or representative by the Radiologist Assistant, and communication documented in the PACS or zVision Dashboard. Electronically Signed   By: Obie DredgeWilliam T Derry M.D.   On: 01/04/2018 11:50    Procedures Procedures (including critical care time)  Medications Ordered in ED Medications  piperacillin-tazobactam (ZOSYN) IVPB 3.375 g (not administered)     Initial Impression / Assessment and Plan / ED Course  I have reviewed the triage vital signs and the nursing notes.  Pertinent labs & imaging results that were available during my care of the patient were reviewed by me and considered in my medical decision making (see chart  for details).      intraabdominal abscess. Personally reviewed the CT images. Labs pending. NPO.  Dr Dwain SarnaWakefield with general surgery to admit. Likely IR drain tomorrow. IV zosyn now    Final Clinical Impressions(s) / ED Diagnoses   Final diagnoses:  Abdominal visceral abscess [facility])    ED Discharge Orders    None       Azalia Bilisampos, Branch Pacitti, MD 01/04/18 1740

## 2018-01-05 ENCOUNTER — Other Ambulatory Visit: Payer: Self-pay

## 2018-01-05 ENCOUNTER — Inpatient Hospital Stay (HOSPITAL_COMMUNITY): Payer: Self-pay

## 2018-01-05 LAB — URINE CULTURE
MICRO NUMBER:: 90313187
SPECIMEN QUALITY:: ADEQUATE

## 2018-01-05 LAB — CBC
HEMATOCRIT: 35.7 % — AB (ref 39.0–52.0)
Hemoglobin: 11.2 g/dL — ABNORMAL LOW (ref 13.0–17.0)
MCH: 24.1 pg — ABNORMAL LOW (ref 26.0–34.0)
MCHC: 31.4 g/dL (ref 30.0–36.0)
MCV: 76.9 fL — ABNORMAL LOW (ref 78.0–100.0)
PLATELETS: 325 10*3/uL (ref 150–400)
RBC: 4.64 MIL/uL (ref 4.22–5.81)
RDW: 15.8 % — AB (ref 11.5–15.5)
WBC: 10.6 10*3/uL — AB (ref 4.0–10.5)

## 2018-01-05 LAB — MRSA PCR SCREENING: MRSA by PCR: NEGATIVE

## 2018-01-05 LAB — PROTIME-INR
INR: 1.1
Prothrombin Time: 14.1 seconds (ref 11.4–15.2)

## 2018-01-05 MED ORDER — FENTANYL CITRATE (PF) 100 MCG/2ML IJ SOLN
INTRAMUSCULAR | Status: AC
  Filled 2018-01-05: qty 4

## 2018-01-05 MED ORDER — FENTANYL CITRATE (PF) 100 MCG/2ML IJ SOLN
INTRAMUSCULAR | Status: AC | PRN
  Administered 2018-01-05 (×3): 50 ug via INTRAVENOUS

## 2018-01-05 MED ORDER — MIDAZOLAM HCL 2 MG/2ML IJ SOLN
INTRAMUSCULAR | Status: AC
  Filled 2018-01-05: qty 4

## 2018-01-05 MED ORDER — SODIUM CHLORIDE 0.9% FLUSH
5.0000 mL | Freq: Three times a day (TID) | INTRAVENOUS | Status: DC
  Administered 2018-01-05 – 2018-01-09 (×11): 5 mL

## 2018-01-05 MED ORDER — LIDOCAINE HCL 1 % IJ SOLN
INTRAMUSCULAR | Status: AC
  Filled 2018-01-05: qty 20

## 2018-01-05 MED ORDER — MIDAZOLAM HCL 2 MG/2ML IJ SOLN
INTRAMUSCULAR | Status: AC | PRN
  Administered 2018-01-05 (×3): 1 mg via INTRAVENOUS

## 2018-01-05 NOTE — Sedation Documentation (Signed)
Patient is resting comfortably. 

## 2018-01-05 NOTE — Progress Notes (Signed)
[facility] Progress Note     Subjective: CC: abdominal pain Pain present over lower abdomen, unchanged from yesterday. Passing flatus, no BM. Urine is dark at times, no pneumaturia. Patient did not have a colonoscopy after last episode of diverticulitis about 1 year ago. UOP good. VSS.   Objective: Vital signs in last 24 hours: Temp:  [98.3 F (36.8 C)-99.5 F (37.5 C)] 98.3 F (36.8 C) (03/13 0802) Pulse Rate:  [55-102] 83 (03/13 0802) Resp:  [16-18] 18 (03/13 0802) BP: (121-148)/(58-96) 137/88 (03/13 0802) SpO2:  [18 %-100 %] 99 % (03/13 0802) Weight:  [92.3 kg (203 lb 7.8 oz)] 92.3 kg (203 lb 7.8 oz) (03/13 0605) Last BM Date: 01/04/18  Intake/Output from previous day: 03/12 0701 - 03/13 0700 In: 950 [I.V.:900; IV Piggyback:50] Out: -  Intake/Output this shift: No intake/output data recorded.  PE: Gen:  Alert, NAD, pleasant Card:  Regular rate and rhythm, pedal pulses 2+ BL Pulm:  Normal effort, clear to auscultation bilaterally Abd: Soft, mildly tender across low abdomen, moderate guarding, no rebound tenderness, non-distended, bowel sounds present Skin: warm and dry, no rashes  Psych: A&Ox3   Lab Results:  Recent Labs    01/04/18 1351 01/05/18 0403  WBC 11.5* 10.6*  HGB 12.8* 11.2*  HCT 40.0 35.7*  PLT 356 325   BMET Recent Labs    01/04/18 1351  NA 137  K 3.9  CL 104  CO2 23  GLUCOSE 101*  BUN 7  CREATININE 0.95  CALCIUM 9.2   PT/INR Recent Labs    01/05/18 0403  LABPROT 14.1  INR 1.10   CMP     Component Value Date/Time   NA 137 01/04/2018 1351   K 3.9 01/04/2018 1351   CL 104 01/04/2018 1351   CO2 23 01/04/2018 1351   GLUCOSE 101 (H) 01/04/2018 1351   BUN 7 01/04/2018 1351   CREATININE 0.95 01/04/2018 1351   CALCIUM 9.2 01/04/2018 1351   PROT 8.5 (H) 01/04/2018 1351   ALBUMIN 4.0 01/04/2018 1351   AST 17 01/04/2018 1351   ALT 18 01/04/2018 1351   ALKPHOS 95 01/04/2018 1351   BILITOT 0.8 01/04/2018 1351    GFRNONAA >60 01/04/2018 1351   GFRAA >60 01/04/2018 1351   Lipase     Component Value Date/Time   LIPASE 16 02/18/2017 1951       Studies/Results: Ct Abdomen Pelvis W Contrast  Result Date: 01/04/2018 CLINICAL DATA:  Intermittent lower abdominal pain for the past 2 weeks. 20 pound weight loss over the past year. History of diverticulitis. EXAM: CT ABDOMEN AND PELVIS WITH CONTRAST TECHNIQUE: Multidetector CT imaging of the abdomen and pelvis was performed using the standard protocol following bolus administration of intravenous contrast. CONTRAST:  125mL ISOVUE-300 IOPAMIDOL (ISOVUE-300) INJECTION 61% COMPARISON:  CT abdomen and pelvis dated February 19, 2017. FINDINGS: Lower chest: No acute abnormality. Hepatobiliary: No focal liver abnormality is seen. No gallstones, gallbladder wall thickening, or biliary dilatation. Pancreas: Unremarkable. No pancreatic ductal dilatation or surrounding inflammatory changes. Spleen: Normal in size without focal abnormality. Adrenals/Urinary Tract: Adrenal glands are unremarkable. Kidneys are normal, without renal calculi, focal lesion, or hydronephrosis. Prominent anterosuperior bladder wall thickening, likely reactive. Stomach/Bowel: Segmental wall thickening and inflammatory changes surrounding the mid sigmoid colon with adjacent phlegmonous change and abscess. The area phlegmonous change measures approximately 5.7 x 5.6 x 4.6 cm (AP by transverse by CC). The rim enhancing fluid collection containing air within the phlegmonous change measures 3.0 x 3.7 x 3.3 cm (  AP by transverse by CC). Phlegmonous change is inseparable from the right anterior superior bladder wall. The remaining colon is unremarkable. Normal appendix. The stomach and small bowel are unremarkable. No bowel obstruction. Vascular/Lymphatic: No significant vascular findings are present. No enlarged abdominal or pelvic lymph nodes. Reproductive: Prostate is unremarkable. Other: Trace free fluid in the  pelvis. No abdominal wall hernia or abnormality. Musculoskeletal: No acute or significant osseous findings. IMPRESSION: 1. Acute sigmoid diverticulitis complicated by adjacent phlegmonous change and 3.7 cm abscess, which are inseparable from the right anterior superior bladder wall. There is reactive bladder wall thickening, but no evidence of air within the bladder to suggest fistula formation. These results will be called to the ordering clinician or representative by the Radiologist Assistant, and communication documented in the PACS or zVision Dashboard. Electronically Signed   By: Obie Dredge M.D.   On: 01/04/2018 11:50    Anti-infectives: Anti-infectives (From admission, onward)   Start     Dose/Rate Route Frequency Ordered Stop   01/04/18 2115  piperacillin-tazobactam (ZOSYN) IVPB 3.375 g     3.375 g 12.5 mL/hr over 240 Minutes Intravenous Every 8 hours 01/04/18 2107     01/04/18 1730  piperacillin-tazobactam (ZOSYN) IVPB 3.375 g     3.375 g 100 mL/hr over 30 Minutes Intravenous  Once 01/04/18 1719 01/04/18 1904       Assessment/Plan Diverticulitis with abscess - CT 3/12: sigmoid diverticulitis with 3.7 cm abscess, reactive bladder wall thickening without air in bladder to suggest fistula - WBC 10.6 from 11.5, afebrile - IR consult pending - hopefully will be able to drain - hopefully can resolve this with conservative measures and have patient follow up for colonoscopy in 6-8 weeks and then follow up to discuss elective surgery, if unable to resolve with conservative measures may require surgery this admission  FEN: NPO, IVF VTE: SCDs ID: IV Zosyn 3/12>>  LOS: 1 day    Wells Guiles , [facility] 01/05/2018, 10:10 AM Pager: [phone] Consults: [phone] Mon-Fri 7:00 am-4:30 pm Sat-Sun 7:00 am-11:30 am

## 2018-01-05 NOTE — Consult Note (Signed)
Chief Complaint: Patient was seen in consultation today for intra-abdominal fluid collection.   Referring Physician(s): Dr. Dwain SarnaWakefield  Supervising Physician: Ruel FavorsShick, Trevor  [facility]  History of Present Illness: Robert Chen is a 32 y.o. male with past medical history of diverticulitis and hypertension who [facility] with abdominal pain and pressure.   CT Abdomen Pelvis 01/04/18 showed: 1. Acute sigmoid diverticulitis complicated by adjacent phlegmonous change and 3.7 cm abscess, which are inseparable from the right anterior superior bladder wall. There is reactive bladder wall thickening, but no evidence of air within the bladder to suggest fistula formation.  IR consulted for aspiration and drainage of intra-abdominal fluid collection at the request of Dr. Dwain SarnaWakefield.   Patient has been NPO this AM.  He does not take blood thinners.   Past Medical History:  Diagnosis Date  . Diverticulitis   . Hypertension     Past Surgical History:  Procedure Laterality Date  . NO PAST SURGERIES      Allergies: Patient has no known allergies.  Medications: Prior to Admission medications   Medication Sig Start Date End Date Taking? Authorizing Provider  acetaminophen (TYLENOL) 500 MG tablet Take 1,000 mg by mouth every 6 (six) hours as needed for headache.   Yes [provider]     Family History  Problem Relation Age of Onset  . Diabetes Father     Social History   Socioeconomic History  . Marital status: Married    Spouse name: None  . Number of children: 1  . Years of education: None  . Highest education level: None  Social Needs  . Financial resource strain: None  . Food insecurity - worry: None  . Food insecurity - inability: None  . Transportation needs - medical: None  . Transportation needs - non-medical: None  Occupational History  . Occupation: Holiday representative[employer]  Tobacco Use  . Smoking status: Never Smoker  . Smokeless  tobacco: Never Used  Substance and Sexual Activity  . Alcohol use: No  . Drug use: No  . Sexual activity: None  Other Topics Concern  . None  Social History Narrative   Fun/Hobby: Soccer     Review of Systems: A 12 point ROS discussed and pertinent positives are indicated in the HPI above.  All other systems are negative.  Review of Systems  Constitutional: Negative for fatigue and fever.  Respiratory: Negative for cough and shortness of breath.   Cardiovascular: Negative for chest pain.  Gastrointestinal: Positive for abdominal pain.  Psychiatric/Behavioral: Negative for behavioral problems and confusion.    Vital Signs: BP 137/88 (BP Location: Right Arm)   Pulse 83   Temp 98.3 F (36.8 C) (Oral)   Resp 18   Ht 5\' 8"  (1.727 m)   Wt 203 lb 7.8 oz (92.3 kg)   SpO2 99%   BMI 30.94 kg/m   Physical Exam  Constitutional: He is oriented to person, place, and time. He appears well-developed.  Cardiovascular: Normal rate, regular rhythm and normal heart sounds.  Pulmonary/Chest: Effort normal and breath sounds normal. No respiratory distress.  Abdominal: He exhibits no distension. There is tenderness.  Neurological: He is alert and oriented to person, place, and time.  Skin: Skin is warm and dry.  Psychiatric: He has a normal mood and affect. His behavior is normal.  Nursing note and vitals reviewed.      Imaging: Ct Abdomen Pelvis W Contrast  Result Date: 01/04/2018 CLINICAL DATA:  Intermittent lower abdominal  pain for the past 2 weeks. 20 pound weight loss over the past year. History of diverticulitis. EXAM: CT ABDOMEN AND PELVIS WITH CONTRAST TECHNIQUE: Multidetector CT imaging of the abdomen and pelvis was performed using the standard protocol following bolus administration of intravenous contrast. CONTRAST:  ISOVUE-300 IOPAMIDOL (ISOVUE-300) INJECTION 61% COMPARISON:  CT abdomen and pelvis dated February 19, 2017. FINDINGS: Lower chest: No acute abnormality.  Hepatobiliary: No focal liver abnormality is seen. No gallstones, gallbladder wall thickening, or biliary dilatation. Pancreas: Unremarkable. No pancreatic ductal dilatation or surrounding inflammatory changes. Spleen: Normal in size without focal abnormality. Adrenals/Urinary Tract: Adrenal glands are unremarkable. Kidneys are normal, without renal calculi, focal lesion, or hydronephrosis. Prominent anterosuperior bladder wall thickening, likely reactive. Stomach/Bowel: Segmental wall thickening and inflammatory changes surrounding the mid sigmoid colon with adjacent phlegmonous change and abscess. The area phlegmonous change measures approximately 5.7 x 5.6 x 4.6 cm (AP by transverse by CC). The rim enhancing fluid collection containing air within the phlegmonous change measures 3.0 x 3.7 x 3.3 cm (AP by transverse by CC). Phlegmonous change is inseparable from the right anterior superior bladder wall. The remaining colon is unremarkable. Normal appendix. The stomach and small bowel are unremarkable. No bowel obstruction. Vascular/Lymphatic: No significant vascular findings are present. No enlarged abdominal or pelvic lymph nodes. Reproductive: Prostate is unremarkable. Other: Trace free fluid in the pelvis. No abdominal wall hernia or abnormality. Musculoskeletal: No acute or significant osseous findings. IMPRESSION: 1. Acute sigmoid diverticulitis complicated by adjacent phlegmonous change and 3.7 cm abscess, which are inseparable from the right anterior superior bladder wall. There is reactive bladder wall thickening, but no evidence of air within the bladder to suggest fistula formation. These results will be called to the ordering clinician or representative by the Radiologist Assistant, and communication documented in the PACS or zVision Dashboard. Electronically Signed   By: Obie Dredge M.D.   On: 01/04/2018 11:50    Labs:  CBC: Recent Labs    03/12/17 0841 12/28/17 1157 01/04/18 1351  01/05/18 0403  WBC 5.2 6.3 11.5* 10.6*  HGB 12.1* 13.4 12.8* 11.2*  HCT 38.1* 41.9 40.0 35.7*  PLT 399.0 385.0 356 325    COAGS: Recent Labs    01/05/18 0403  INR 1.10    BMP: Recent Labs    02/18/17 1951 02/19/17 0447 02/20/17 0605 12/28/17 1157 01/04/18 1351  NA 137 136 137 139 137  K 4.0 3.7 4.3 4.0 3.9  CL 103 104 108 105 104  CO2 23 23 22 27 23   GLUCOSE 138* 126* 87 101* 101*  BUN 12 10 11 13 7   CALCIUM 9.1 8.4* 8.4* 9.7 9.2  CREATININE 1.06 1.01 0.95 0.98 0.95  GFRNONAA >60 >60 >60  --  >60  GFRAA >60 >60 >60  --  >60    LIVER FUNCTION TESTS: Recent Labs    02/18/17 1951 12/28/17 1157 01/04/18 1351  BILITOT 1.0 0.5 0.8  AST 21 15 17   ALT 21 16 18   ALKPHOS 93 100 95  PROT 7.8 8.5* 8.5*  ALBUMIN 4.1 4.4 4.0    TUMOR MARKERS: No results for input(s): AFPTM, CEA, CA199, CHROMGRNA in the last 8760 hours.  Assessment and Plan: Patient with past medical history of diverticulitis presents with complaint of abdominal pain. CT Abdomen shows suspected diverticular abscess.  IR consulted for aspiration and drainage at the request of Dr. Dwain Sarna. Case reviewed by Dr. Miles Costain who approves patient for procedure.  Patient presents today in their usual state of  health.  He has been NPO and is not currently on blood thinners.    Risks and benefits discussed with the patient including bleeding, infection, damage to adjacent structures, bowel perforation/fistula connection, and sepsis.  All of the patient's questions were answered, patient is agreeable to proceed. Consent signed and in chart.  Thank you for this interesting consult.  I greatly enjoyed meeting [address] and look forward to participating in their care.  A copy of this report was sent to the requesting provider on this date.  Electronically Signed: Hoyt Koch, PA 01/05/2018, 11:04 AM   I spent a total of 40 Minutes    in face to face in clinical consultation, greater than 50%  of which was counseling/coordinating care for abdominal pain

## 2018-01-05 NOTE — Progress Notes (Addendum)
Pt returns from IR with R sided JP drain to bulb suction..VSS  Mikal PlaneAyaba Elzy Tomasello, BSN, RN

## 2018-01-05 NOTE — Procedures (Signed)
Pelvic diverticular abscess  S/p CT drain No comp Stable EBL 0 Full report in pacs cx sent

## 2018-01-05 NOTE — Progress Notes (Signed)
Called IR to inquire about time patient procedure be performed. per [facility] attending physician request.  Stated unable to determine the exact time due to ongoing emergency case but confirmed patient on schedule for today. Patient made aware. Elvera BickerAyaba M Annarae Macnair, BSN, RN

## 2018-01-06 LAB — BASIC METABOLIC PANEL
ANION GAP: 12 (ref 5–15)
BUN: 11 mg/dL (ref 6–20)
CALCIUM: 9 mg/dL (ref 8.9–10.3)
CHLORIDE: 104 mmol/L (ref 101–111)
CO2: 22 mmol/L (ref 22–32)
Creatinine, Ser: 1.1 mg/dL (ref 0.61–1.24)
GFR calc Af Amer: >60 mL/min (ref >60)
GFR calc non Af Amer: >60 mL/min (ref >60)
Glucose, Bld: 81 mg/dL (ref 65–99)
Potassium: 4 mmol/L (ref 3.5–5.1)
Sodium: 138 mmol/L (ref 135–145)

## 2018-01-06 LAB — CBC
HEMATOCRIT: 40.2 % (ref 39.0–52.0)
HEMOGLOBIN: 12.9 g/dL — AB (ref 13.0–17.0)
MCH: 24.9 pg — ABNORMAL LOW (ref 26.0–34.0)
MCHC: 32.1 g/dL (ref 30.0–36.0)
MCV: 77.6 fL — ABNORMAL LOW (ref 78.0–100.0)
Platelets: 425 10*3/uL — ABNORMAL HIGH (ref 150–400)
RBC: 5.18 MIL/uL (ref 4.22–5.81)
RDW: 15.8 % — AB (ref 11.5–15.5)
WBC: 9.8 10*3/uL (ref 4.0–10.5)

## 2018-01-06 NOTE — Progress Notes (Signed)
Results of urine culture noted. Patient was complaining of some urinary symptoms on admission, but reported earlier today that those symptoms were improving. Spoke with pharmacy about antibiotic coverage for UTI as well as diverticular abscess. Zosyn should provide adequate coverage, no change in therapy.  Wells GuilesKelly Rayburn , [facility] 01/06/2018, 1:50 PM Pager: [phone] Consults: [phone] Mon-Fri 7:00 am-4:30 pm Sat-Sun 7:00 am-11:30 am

## 2018-01-06 NOTE — Care Management Note (Signed)
Case Management Note  Patient Details  Name: Sullivan LoneFrancisco Gumz MRN: [id number] Date of Birth: 1986/09/04  Subjective/Objective: Pt admitted on 01/04/18 with diverticular abscess.  PTA, pt independent of ADLS, lives with spouse.                    Action/Plan: Will follow for dc needs as pt progresses. Pt uninsured, may need medication assistance at dc.  Will speak with him regarding PCP follow up.     Expected Discharge Date:                  Expected Discharge Plan:  Home/Self Care  In-House Referral:     Discharge planning Services  CM Consult  Post Acute Care Choice:    Choice offered to:     DME Arranged:    DME Agency:     HH Arranged:    HH Agency:     Status of Service:  In process, will continue to follow  If discussed at Long Length of Stay Meetings, dates discussed:    Additional Comments:  Quintella BatonJulie W. Spyros Winch, RN, BSN  Trauma/Neuro ICU Case Manager [phone]

## 2018-01-06 NOTE — Progress Notes (Signed)
[facility] Progress Note     Subjective: CC: abdominal pain Patient still having abdominal pain, mostly in RLQ. Improved slightly from yesterday. No nausea. Color of urine more normal today. UOP good. VSS.   Objective: Vital signs in last 24 hours: Temp:  [97.7 F (36.5 C)-98.2 F (36.8 C)] 98.1 F (36.7 C) (03/14 0752) Pulse Rate:  [64-92] 69 (03/14 0752) Resp:  [12-25] 18 (03/13 1635) BP: (128-149)/(68-115) 140/76 (03/14 0752) SpO2:  [97 %-100 %] 99 % (03/14 0752) Last BM Date: 01/04/18  Intake/Output from previous day: 03/13 0701 - 03/14 0700 In: 1661.7 [I.V.:1551.7; IV Piggyback:100] Out: 35 [Drains:35] Intake/Output this shift: Total I/O In: 450 [I.V.:400; IV Piggyback:50] Out: -   PE: Gen:  Alert, NAD, pleasant Card:  Regular rate and rhythm, pedal pulses 2+ BL Pulm:  Normal effort, clear to auscultation bilaterally Abd: Soft, TTP in RLQ, non-distended, bowel sounds present, no HSM, drain with sanguinous output Skin: warm and dry, no rashes  Psych: A&Ox3   Lab Results:  Recent Labs    01/04/18 1351 01/05/18 0403  WBC 11.5* 10.6*  HGB 12.8* 11.2*  HCT 40.0 35.7*  PLT 356 325   BMET Recent Labs    01/04/18 1351  NA 137  K 3.9  CL 104  CO2 23  GLUCOSE 101*  BUN 7  CREATININE 0.95  CALCIUM 9.2   PT/INR Recent Labs    01/05/18 0403  LABPROT 14.1  INR 1.10   CMP     Component Value Date/Time   NA 137 01/04/2018 1351   K 3.9 01/04/2018 1351   CL 104 01/04/2018 1351   CO2 23 01/04/2018 1351   GLUCOSE 101 (H) 01/04/2018 1351   BUN 7 01/04/2018 1351   CREATININE 0.95 01/04/2018 1351   CALCIUM 9.2 01/04/2018 1351   PROT 8.5 (H) 01/04/2018 1351   ALBUMIN 4.0 01/04/2018 1351   AST 17 01/04/2018 1351   ALT 18 01/04/2018 1351   ALKPHOS 95 01/04/2018 1351   BILITOT 0.8 01/04/2018 1351   GFRNONAA >60 01/04/2018 1351   GFRAA >60 01/04/2018 1351   Lipase     Component Value Date/Time   LIPASE 16 02/18/2017 1951        Studies/Results: Ct Abdomen Pelvis W Contrast  Result Date: 01/04/2018 CLINICAL DATA:  Intermittent lower abdominal pain for the past 2 weeks. 20 pound weight loss over the past year. History of diverticulitis. EXAM: CT ABDOMEN AND PELVIS WITH CONTRAST TECHNIQUE: Multidetector CT imaging of the abdomen and pelvis was performed using the standard protocol following bolus administration of intravenous contrast. CONTRAST:  ISOVUE-300 IOPAMIDOL (ISOVUE-300) INJECTION 61% COMPARISON:  CT abdomen and pelvis dated February 19, 2017. FINDINGS: Lower chest: No acute abnormality. Hepatobiliary: No focal liver abnormality is seen. No gallstones, gallbladder wall thickening, or biliary dilatation. Pancreas: Unremarkable. No pancreatic ductal dilatation or surrounding inflammatory changes. Spleen: Normal in size without focal abnormality. Adrenals/Urinary Tract: Adrenal glands are unremarkable. Kidneys are normal, without renal calculi, focal lesion, or hydronephrosis. Prominent anterosuperior bladder wall thickening, likely reactive. Stomach/Bowel: Segmental wall thickening and inflammatory changes surrounding the mid sigmoid colon with adjacent phlegmonous change and abscess. The area phlegmonous change measures approximately 5.7 x 5.6 x 4.6 cm (AP by transverse by CC). The rim enhancing fluid collection containing air within the phlegmonous change measures 3.0 x 3.7 x 3.3 cm (AP by transverse by CC). Phlegmonous change is inseparable from the right anterior superior bladder wall. The remaining colon is unremarkable. Normal appendix. The stomach  and small bowel are unremarkable. No bowel obstruction. Vascular/Lymphatic: No significant vascular findings are present. No enlarged abdominal or pelvic lymph nodes. Reproductive: Prostate is unremarkable. Other: Trace free fluid in the pelvis. No abdominal wall hernia or abnormality. Musculoskeletal: No acute or significant osseous findings. IMPRESSION: 1. Acute  sigmoid diverticulitis complicated by adjacent phlegmonous change and 3.7 cm abscess, which are inseparable from the right anterior superior bladder wall. There is reactive bladder wall thickening, but no evidence of air within the bladder to suggest fistula formation. These results will be called to the ordering clinician or representative by the Radiologist Assistant, and communication documented in the PACS or zVision Dashboard. Electronically Signed   By: Obie Dredge M.D.   On: 01/04/2018 11:50   Ct Image Guided Drainage By Percutaneous Catheter  Result Date: 01/05/2018 INDICATION: Pelvic diverticular abscess EXAM: CT GUIDED DRAINAGE OF PELVIC DIVERTICULAR ABSCESS MEDICATIONS: The patient is currently admitted to the hospital and receiving intravenous antibiotics. The antibiotics were administered within an appropriate time frame prior to the initiation of the procedure. ANESTHESIA/SEDATION: 3.0 mg IV Versed 150 mcg IV Fentanyl Moderate Sedation Time:  13 MINUTES The patient was continuously monitored during the procedure by the interventional radiology nurse under my direct supervision. COMPLICATIONS: None immediate. TECHNIQUE: Informed written consent was obtained from the patient after a thorough discussion of the procedural risks, benefits and alternatives. All questions were addressed. Maximal Sterile Barrier Technique was utilized including caps, mask, sterile gowns, sterile gloves, sterile drape, hand hygiene and skin antiseptic. A timeout was performed prior to the initiation of the procedure. PROCEDURE: Previous imaging reviewed. Patient positioned supine. Noncontrast localization CT performed. The midline anterior pelvic abscess was localized with CT. Overlying skin marked for a right anterior oblique approach. Under sterile conditions and local anesthesia, an 18 gauge 10 cm access needle was advanced from an anterior oblique approach into the pelvic abscess. Needle position confirmed with CT.  Guidewire inserted followed by tract dilatation to insert a 10 [place] drain. Drain catheter position confirmed with CT. Syringe aspiration yielded bloody exudative fluid. Sample sent for culture. Catheter secured with a Prolene suture and connected to external suction bulb. Sterile dressing applied. No immediate complication. Patient tolerated the procedure well. FINDINGS: CT imaging confirms percutaneous placement of CT drain for the pelvic diverticular abscess IMPRESSION: Successful CT drainage of the pelvic diverticular abscess. Electronically Signed   By: Judie Petit.  Shick M.D.   On: 01/05/2018 17:15    Anti-infectives: Anti-infectives (From admission, onward)   Start     Dose/Rate Route Frequency Ordered Stop   01/04/18 2115  piperacillin-tazobactam (ZOSYN) IVPB 3.375 g     3.375 g 12.5 mL/hr over 240 Minutes Intravenous Every 8 hours 01/04/18 2107     01/04/18 1730  piperacillin-tazobactam (ZOSYN) IVPB 3.375 g     3.375 g 100 mL/hr over 30 Minutes Intravenous  Once 01/04/18 1719 01/04/18 1904       Assessment/Plan Diverticulitis with abscess S/p percutaneous drain 3/13 - CT 3/12: sigmoid diverticulitis with 3.7 cm abscess, reactive bladder wall thickening without air in bladder to suggest fistula - WBC 10.6 yesterday - repeat cbc - drain with 35 cc out overnight - patient still TTP - may have ice chips but would not advance past that - hopefully can resolve this with conservative measures and have patient follow up for colonoscopy in 6-8 weeks and then follow up to discuss elective surgery, if unable to resolve with conservative measures may require surgery this admission  FEN: NPO, IVF  VTE: SCDs, lovenox ID: IV Zosyn 3/12>>    LOS: 2 days    Wells GuilesKelly Rayburn , [facility] 01/06/2018, 9:42 AM Pager: [phone] Consults: [phone] Mon-Fri 7:00 am-4:30 pm Sat-Sun 7:00 am-11:30 am

## 2018-01-06 NOTE — Progress Notes (Signed)
Referring Physician(s): Dr. Dwain Sarna  Supervising Physician: Gilmer Mor  Patient Status:  [facility] - In-pt  Chief Complaint: Intra-abdominal fluid collection  Subjective: Improved after aspiration and drain yesterday.   Allergies: Patient has no known allergies.  Medications: Prior to Admission medications   Medication Sig Start Date End Date Taking? Authorizing Provider  acetaminophen (TYLENOL) 500 MG tablet Take 1,000 mg by mouth every 6 (six) hours as needed for headache.   Yes [provider]     Vital Signs: BP 121/90 (BP Location: Right Arm)   Pulse (!) 109   Temp 98 F (36.7 C) (Oral)   Resp 18   Ht 5\' 8"  (1.727 m)   Wt 203 lb 7.8 oz (92.3 kg)   SpO2 100%   BMI 30.94 kg/m   Physical Exam  Constitutional: He is oriented to person, place, and time. He appears well-developed.  Pulmonary/Chest: Effort normal. No respiratory distress.  Abdominal:  Drain in place.  Insertion site c/d/i. Dark, blood-tinged output.   Neurological: He is alert and oriented to person, place, and time.  Skin: Skin is warm and dry.  Psychiatric: He has a normal mood and affect. His behavior is normal.  Nursing note and vitals reviewed.   Imaging: Ct Abdomen Pelvis W Contrast  Result Date: 01/04/2018 CLINICAL DATA:  Intermittent lower abdominal pain for the past 2 weeks. 20 pound weight loss over the past year. History of diverticulitis. EXAM: CT ABDOMEN AND PELVIS WITH CONTRAST TECHNIQUE: Multidetector CT imaging of the abdomen and pelvis was performed using the standard protocol following bolus administration of intravenous contrast. CONTRAST:  ISOVUE-300 IOPAMIDOL (ISOVUE-300) INJECTION 61% COMPARISON:  CT abdomen and pelvis dated February 19, 2017. FINDINGS: Lower chest: No acute abnormality. Hepatobiliary: No focal liver abnormality is seen. No gallstones, gallbladder wall thickening, or biliary dilatation. Pancreas: Unremarkable. No pancreatic ductal dilatation or  surrounding inflammatory changes. Spleen: Normal in size without focal abnormality. Adrenals/Urinary Tract: Adrenal glands are unremarkable. Kidneys are normal, without renal calculi, focal lesion, or hydronephrosis. Prominent anterosuperior bladder wall thickening, likely reactive. Stomach/Bowel: Segmental wall thickening and inflammatory changes surrounding the mid sigmoid colon with adjacent phlegmonous change and abscess. The area phlegmonous change measures approximately 5.7 x 5.6 x 4.6 cm (AP by transverse by CC). The rim enhancing fluid collection containing air within the phlegmonous change measures 3.0 x 3.7 x 3.3 cm (AP by transverse by CC). Phlegmonous change is inseparable from the right anterior superior bladder wall. The remaining colon is unremarkable. Normal appendix. The stomach and small bowel are unremarkable. No bowel obstruction. Vascular/Lymphatic: No significant vascular findings are present. No enlarged abdominal or pelvic lymph nodes. Reproductive: Prostate is unremarkable. Other: Trace free fluid in the pelvis. No abdominal wall hernia or abnormality. Musculoskeletal: No acute or significant osseous findings. IMPRESSION: 1. Acute sigmoid diverticulitis complicated by adjacent phlegmonous change and 3.7 cm abscess, which are inseparable from the right anterior superior bladder wall. There is reactive bladder wall thickening, but no evidence of air within the bladder to suggest fistula formation. These results will be called to the ordering clinician or representative by the Radiologist Assistant, and communication documented in the PACS or zVision Dashboard. Electronically Signed   By: Obie Dredge M.D.   On: 01/04/2018 11:50   Ct Image Guided Drainage By Percutaneous Catheter  Result Date: 01/05/2018 INDICATION: Pelvic diverticular abscess EXAM: CT GUIDED DRAINAGE OF PELVIC DIVERTICULAR ABSCESS MEDICATIONS: The patient is currently admitted to the hospital and receiving intravenous  antibiotics. The antibiotics  were administered within an appropriate time frame prior to the initiation of the procedure. ANESTHESIA/SEDATION: 3.0 mg IV Versed 150 mcg IV Fentanyl Moderate Sedation Time:  13 MINUTES The patient was continuously monitored during the procedure by the interventional radiology nurse under my direct supervision. COMPLICATIONS: None immediate. TECHNIQUE: Informed written consent was obtained from the patient after a thorough discussion of the procedural risks, benefits and alternatives. All questions were addressed. Maximal Sterile Barrier Technique was utilized including caps, mask, sterile gowns, sterile gloves, sterile drape, hand hygiene and skin antiseptic. A timeout was performed prior to the initiation of the procedure. PROCEDURE: Previous imaging reviewed. Patient positioned supine. Noncontrast localization CT performed. The midline anterior pelvic abscess was localized with CT. Overlying skin marked for a right anterior oblique approach. Under sterile conditions and local anesthesia, an 18 gauge 10 cm access needle was advanced from an anterior oblique approach into the pelvic abscess. Needle position confirmed with CT. Guidewire inserted followed by tract dilatation to insert a 10 [place] drain. Drain catheter position confirmed with CT. Syringe aspiration yielded bloody exudative fluid. Sample sent for culture. Catheter secured with a Prolene suture and connected to external suction bulb. Sterile dressing applied. No immediate complication. Patient tolerated the procedure well. FINDINGS: CT imaging confirms percutaneous placement of CT drain for the pelvic diverticular abscess IMPRESSION: Successful CT drainage of the pelvic diverticular abscess. Electronically Signed   By: Judie PetitM.  Shick M.D.   On: 01/05/2018 17:15    Labs:  CBC: Recent Labs    12/28/17 1157 01/04/18 1351 01/05/18 0403 01/06/18 1053  WBC 6.3 11.5* 10.6* 9.8  HGB 13.4 12.8* 11.2* 12.9*  HCT 41.9 40.0  35.7* 40.2  PLT 385.0 356 325 425*    COAGS: Recent Labs    01/05/18 0403  INR 1.10    BMP: Recent Labs    02/19/17 0447 02/20/17 0605 12/28/17 1157 01/04/18 1351 01/06/18 1053  NA 136 137 139 137 138  K 3.7 4.3 4.0 3.9 4.0  CL 104 108 105 104 104  CO2 23 22 27 23 22   GLUCOSE 126* 87 101* 101* 81  BUN 10 11 13 7 11   CALCIUM 8.4* 8.4* 9.7 9.2 9.0  CREATININE 1.01 0.95 0.98 0.95 1.10  GFRNONAA >60 >60  --  >60 >60  GFRAA >60 >60  --  >60 >60    LIVER FUNCTION TESTS: Recent Labs    02/18/17 1951 12/28/17 1157 01/04/18 1351  BILITOT 1.0 0.5 0.8  AST 21 15 17   ALT 21 16 18   ALKPHOS 93 100 95  PROT 7.8 8.5* 8.5*  ALBUMIN 4.1 4.4 4.0    Assessment and Plan: Intra-abdominal fluid collection s/p aspiration and drainage 3/13 Patient feels better today after aspiration and drain placement.  Up OOB, walking in room. Tolerating clear liquids.  Dark, blood-tinged fluid in bulb.  Cultures pending but growing gram positive cocci.  Site intact.  Continue current management.   Electronically Signed: Hoyt KochKacie Sue-Ellen Matthews, PA 01/06/2018, 3:53 PM   I spent a total of 15 Minutes at the the patient's bedside AND on the patient's hospital floor or unit, greater than 50% of which was counseling/coordinating care for intra-abdominal fluid collection.

## 2018-01-07 MED ORDER — OXYCODONE HCL 5 MG PO TABS: 5.0000 mg | ORAL_TABLET | ORAL | Status: DC | PRN

## 2018-01-07 MED ORDER — AMOXICILLIN-POT CLAVULANATE 875-125 MG PO TABS
1.0000 | ORAL_TABLET | Freq: Two times a day (BID) | ORAL | Status: DC
  Administered 2018-01-07 – 2018-01-09 (×4): 1 via ORAL
  Filled 2018-01-07 (×6): qty 1

## 2018-01-07 MED ORDER — HYDROMORPHONE HCL 1 MG/ML IJ SOLN: 0.5000 mg | INTRAMUSCULAR | Status: DC | PRN

## 2018-01-07 NOTE — Progress Notes (Signed)
[facility] Progress Note     Subjective: CC-  Patient states that he is feeling better today. Only complains of RLQ pain when drain is being flushed. Tolerating clear liquids. Denies n/v. States that PO intake does not make his abdominal pain worse.  Objective: Vital signs in last 24 hours: Temp:  [97.6 F (36.4 C)-98.2 F (36.8 C)] 98.2 F (36.8 C) (03/15 0822) Pulse Rate:  [62-109] 62 (03/15 0822) BP: (121-139)/(72-90) 135/77 (03/15 0822) SpO2:  [99 %-100 %] 99 % (03/15 0822) Last BM Date: 01/04/18  Intake/Output from previous day: 03/14 0701 - 03/15 0700 In: 2685 [P.O.:120; I.V.:2400; IV Piggyback:150] Out: 60 [Drains:60] Intake/Output this shift: No intake/output data recorded.  PE: Gen:  Alert, NAD, pleasant HEENT: EOM's intact, pupils equal and round Card:  RRR, no M/G/R heard Pulm:  CTAB, no W/R/R, effort normal Abd: Soft, ND, +BS, drain with minimal dark sanguinous drainage, TTP around drain Ext:  Calves soft and nontender Psych: A&Ox3  Skin: no rashes noted, warm and dry  Lab Results:  Recent Labs    01/05/18 0403 01/06/18 1053  WBC 10.6* 9.8  HGB 11.2* 12.9*  HCT 35.7* 40.2  PLT 325 425*   BMET Recent Labs    01/04/18 1351 01/06/18 1053  NA 137 138  K 3.9 4.0  CL 104 104  CO2 23 22  GLUCOSE 101* 81  BUN 7 11  CREATININE 0.95 1.10  CALCIUM 9.2 9.0   PT/INR Recent Labs    01/05/18 0403  LABPROT 14.1  INR 1.10   CMP     Component Value Date/Time   NA 138 01/06/2018 1053   K 4.0 01/06/2018 1053   CL 104 01/06/2018 1053   CO2 22 01/06/2018 1053   GLUCOSE 81 01/06/2018 1053   BUN 11 01/06/2018 1053   CREATININE 1.10 01/06/2018 1053   CALCIUM 9.0 01/06/2018 1053   PROT 8.5 (H) 01/04/2018 1351   ALBUMIN 4.0 01/04/2018 1351   AST 17 01/04/2018 1351   ALT 18 01/04/2018 1351   ALKPHOS 95 01/04/2018 1351   BILITOT 0.8 01/04/2018 1351   GFRNONAA >60 01/06/2018 1053   GFRAA >60 01/06/2018 1053   Lipase     Component  Value Date/Time   LIPASE 16 02/18/2017 1951       Studies/Results: Ct Image Guided Drainage By Percutaneous Catheter  Result Date: 01/05/2018 INDICATION: Pelvic diverticular abscess EXAM: CT GUIDED DRAINAGE OF PELVIC DIVERTICULAR ABSCESS MEDICATIONS: The patient is currently admitted to the hospital and receiving intravenous antibiotics. The antibiotics were administered within an appropriate time frame prior to the initiation of the procedure. ANESTHESIA/SEDATION: 3.0 mg IV Versed 150 mcg IV Fentanyl Moderate Sedation Time:  13 MINUTES The patient was continuously monitored during the procedure by the interventional radiology nurse under my direct supervision. COMPLICATIONS: None immediate. TECHNIQUE: Informed written consent was obtained from the patient after a thorough discussion of the procedural risks, benefits and alternatives. All questions were addressed. Maximal Sterile Barrier Technique was utilized including caps, mask, sterile gowns, sterile gloves, sterile drape, hand hygiene and skin antiseptic. A timeout was performed prior to the initiation of the procedure. PROCEDURE: Previous imaging reviewed. Patient positioned supine. Noncontrast localization CT performed. The midline anterior pelvic abscess was localized with CT. Overlying skin marked for a right anterior oblique approach. Under sterile conditions and local anesthesia, an 18 gauge 10 cm access needle was advanced from an anterior oblique approach into the pelvic abscess. Needle position confirmed with CT. Guidewire inserted followed by  tract dilatation to insert a 10 [place] drain. Drain catheter position confirmed with CT. Syringe aspiration yielded bloody exudative fluid. Sample sent for culture. Catheter secured with a Prolene suture and connected to external suction bulb. Sterile dressing applied. No immediate complication. Patient tolerated the procedure well. FINDINGS: CT imaging confirms percutaneous placement of CT drain for  the pelvic diverticular abscess IMPRESSION: Successful CT drainage of the pelvic diverticular abscess. Electronically Signed   By: Judie Petit.  Shick M.D.   On: 01/05/2018 17:15    Anti-infectives: Anti-infectives (From admission, onward)   Start     Dose/Rate Route Frequency Ordered Stop   01/04/18 2115  piperacillin-tazobactam (ZOSYN) IVPB 3.375 g     3.375 g 12.5 mL/hr over 240 Minutes Intravenous Every 8 hours 01/04/18 2107     01/04/18 1730  piperacillin-tazobactam (ZOSYN) IVPB 3.375 g     3.375 g 100 mL/hr over 30 Minutes Intravenous  Once 01/04/18 1719 01/04/18 1904       Assessment/Plan  UTI - Streptococcus pyogenes, on zosyn Diverticulitis with abscess S/p percutaneous drain 3/13 - CT 3/12: sigmoid diverticulitis with 3.7 cm abscess, reactive bladder wall thickening without air in bladder to suggest fistula - WBC 9.8 yesterday, afebril - drain with 60cc/24hr - hopefully can resolve this with conservative measures and have patient follow up for colonoscopy in 6-8 weeks and then follow up to discuss elective surgery, if unable to resolve with conservative measures may require surgery this admission  FEN: IVF, full liquid diet VTE: SCDs, lovenox ID: IV Zosyn 3/12>>  Plan - Advance to full liquids. Decrease IVF. Add oral pain medications. Continue drain and IV abx. Follow culture.   LOS: 3 days    Franne Forts , [facility] 01/07/2018, 9:41 AM Pager: [phone] Consults: [phone] Mon-Fri 7:00 am-4:30 pm Sat-Sun 7:00 am-11:30 am

## 2018-01-07 NOTE — Progress Notes (Signed)
Referring Physician(s):  CCS Dr Darnelle Spangle  Supervising Physician: Irish Lack  Patient Status:  [facility] - In-pt  Chief Complaint:  Diverticular abscess drain placed in IR [id number]   Subjective:  Feeling better daily Advancing diet Up in room--- ambulating afeb   Allergies: Patient has no known allergies.  Medications: Prior to Admission medications   Medication Sig Start Date End Date Taking? Authorizing Provider  acetaminophen (TYLENOL) 500 MG tablet Take 1,000 mg by mouth every 6 (six) hours as needed for headache.   Yes [provider]     Vital Signs: BP 135/77 (BP Location: Right Arm)   Pulse 62   Temp 98.2 F (36.8 C) (Oral)   Resp 18   Ht 5\' 8"  (1.727 m)   Wt 203 lb 7.8 oz (92.3 kg)   SpO2 99%   BMI 30.94 kg/m   Physical Exam  Constitutional: He is oriented to person, place, and time.  Abdominal: Soft. Normal appearance and bowel sounds are normal. There is tenderness.  Mostly tender at site of drain  Neurological: He is alert and oriented to person, place, and time.  Skin: Skin is warm and dry.  Site is clean and dry Sl tender No sign of infection  OP brownish fluid 60 cc yesterday 10 cc in JP  C abun wbcs-- Cx pending  Nursing note and vitals reviewed.   Imaging: Ct Abdomen Pelvis W Contrast  Result Date: 01/04/2018 CLINICAL DATA:  Intermittent lower abdominal pain for the past 2 weeks. 20 pound weight loss over the past year. History of diverticulitis. EXAM: CT ABDOMEN AND PELVIS WITH CONTRAST TECHNIQUE: Multidetector CT imaging of the abdomen and pelvis was performed using the standard protocol following bolus administration of intravenous contrast. CONTRAST:  ISOVUE-300 IOPAMIDOL (ISOVUE-300) INJECTION 61% COMPARISON:  CT abdomen and pelvis dated February 19, 2017. FINDINGS: Lower chest: No acute abnormality. Hepatobiliary: No focal liver abnormality is seen. No gallstones, gallbladder wall thickening, or biliary dilatation.  Pancreas: Unremarkable. No pancreatic ductal dilatation or surrounding inflammatory changes. Spleen: Normal in size without focal abnormality. Adrenals/Urinary Tract: Adrenal glands are unremarkable. Kidneys are normal, without renal calculi, focal lesion, or hydronephrosis. Prominent anterosuperior bladder wall thickening, likely reactive. Stomach/Bowel: Segmental wall thickening and inflammatory changes surrounding the mid sigmoid colon with adjacent phlegmonous change and abscess. The area phlegmonous change measures approximately 5.7 x 5.6 x 4.6 cm (AP by transverse by CC). The rim enhancing fluid collection containing air within the phlegmonous change measures 3.0 x 3.7 x 3.3 cm (AP by transverse by CC). Phlegmonous change is inseparable from the right anterior superior bladder wall. The remaining colon is unremarkable. Normal appendix. The stomach and small bowel are unremarkable. No bowel obstruction. Vascular/Lymphatic: No significant vascular findings are present. No enlarged abdominal or pelvic lymph nodes. Reproductive: Prostate is unremarkable. Other: Trace free fluid in the pelvis. No abdominal wall hernia or abnormality. Musculoskeletal: No acute or significant osseous findings. IMPRESSION: 1. Acute sigmoid diverticulitis complicated by adjacent phlegmonous change and 3.7 cm abscess, which are inseparable from the right anterior superior bladder wall. There is reactive bladder wall thickening, but no evidence of air within the bladder to suggest fistula formation. These results will be called to the ordering clinician or representative by the Radiologist Assistant, and communication documented in the PACS or zVision Dashboard. Electronically Signed   By: Obie Dredge M.D.   On: 01/04/2018 11:50   Ct Image Guided Drainage By Percutaneous Catheter  Result Date: 01/05/2018 INDICATION: Pelvic diverticular  abscess EXAM: CT GUIDED DRAINAGE OF PELVIC DIVERTICULAR ABSCESS MEDICATIONS: The patient is  currently admitted to the hospital and receiving intravenous antibiotics. The antibiotics were administered within an appropriate time frame prior to the initiation of the procedure. ANESTHESIA/SEDATION: 3.0 mg IV Versed 150 mcg IV Fentanyl Moderate Sedation Time:  13 MINUTES The patient was continuously monitored during the procedure by the interventional radiology nurse under my direct supervision. COMPLICATIONS: None immediate. TECHNIQUE: Informed written consent was obtained from the patient after a thorough discussion of the procedural risks, benefits and alternatives. All questions were addressed. Maximal Sterile Barrier Technique was utilized including caps, mask, sterile gowns, sterile gloves, sterile drape, hand hygiene and skin antiseptic. A timeout was performed prior to the initiation of the procedure. PROCEDURE: Previous imaging reviewed. Patient positioned supine. Noncontrast localization CT performed. The midline anterior pelvic abscess was localized with CT. Overlying skin marked for a right anterior oblique approach. Under sterile conditions and local anesthesia, an 18 gauge 10 cm access needle was advanced from an anterior oblique approach into the pelvic abscess. Needle position confirmed with CT. Guidewire inserted followed by tract dilatation to insert a 10 [place] drain. Drain catheter position confirmed with CT. Syringe aspiration yielded bloody exudative fluid. Sample sent for culture. Catheter secured with a Prolene suture and connected to external suction bulb. Sterile dressing applied. No immediate complication. Patient tolerated the procedure well. FINDINGS: CT imaging confirms percutaneous placement of CT drain for the pelvic diverticular abscess IMPRESSION: Successful CT drainage of the pelvic diverticular abscess. Electronically Signed   By: Judie PetitM.  Shick M.D.   On: 01/05/2018 17:15    Labs:  CBC: Recent Labs    12/28/17 1157 01/04/18 1351 01/05/18 0403 01/06/18 1053  WBC 6.3  11.5* 10.6* 9.8  HGB 13.4 12.8* 11.2* 12.9*  HCT 41.9 40.0 35.7* 40.2  PLT 385.0 356 325 425*    COAGS: Recent Labs    01/05/18 0403  INR 1.10    BMP: Recent Labs    02/19/17 0447 02/20/17 0605 12/28/17 1157 01/04/18 1351 01/06/18 1053  NA 136 137 139 137 138  K 3.7 4.3 4.0 3.9 4.0  CL 104 108 105 104 104  CO2 23 22 27 23 22   GLUCOSE 126* 87 101* 101* 81  BUN 10 11 13 7 11   CALCIUM 8.4* 8.4* 9.7 9.2 9.0  CREATININE 1.01 0.95 0.98 0.95 1.10  GFRNONAA >60 >60  --  >60 >60  GFRAA >60 >60  --  >60 >60    LIVER FUNCTION TESTS: Recent Labs    02/18/17 1951 12/28/17 1157 01/04/18 1351  BILITOT 1.0 0.5 0.8  AST 21 15 17   ALT 21 16 18   ALKPHOS 93 100 95  PROT 7.8 8.5* 8.5*  ALBUMIN 4.1 4.4 4.0    Assessment and Plan:  divertic drain intact Needs flush at home daily 5 cc sterile saline IR OP CLINIC will call pt with time and date of follow up appt Pt is aware Plan per CCS  Electronically Signed: [place] A, PA-C 01/07/2018, 10:14 AM   I spent a total of 15 Minutes at the the patient's bedside AND on the patient's hospital floor or unit, greater than 50% of which was counseling/coordinating care for diverticular drain

## 2018-01-07 NOTE — Care Management Note (Signed)
Case Management Note  Patient Details  Name: Robert Chen MRN: [id number] Date of Birth: 1985-11-04  Subjective/Objective: Pt admitted on 01/04/18 with diverticular abscess.  PTA, pt independent of ADLS, lives with spouse.                    Action/Plan: Will follow for dc needs as pt progresses. Pt uninsured, may need medication assistance at dc.  Will speak with him regarding PCP follow up.     Expected Discharge Date:                  Expected Discharge Plan:  Home/Self Care  In-House Referral:     Discharge planning Services  CM Consult, MATCH Program, Indigent Health Clinic  Post Acute Care Choice:    Choice offered to:     DME Arranged:    DME Agency:     HH Arranged:    HH Agency:     Status of Service:  Completed, signed off  If discussed at [employer] Meetings, dates discussed:    Additional Comments:  01/07/18 J. Hassel Uphoff, RN, BSN Pt for possible dc over the weekend.  Pt uninsured, but is eligible for medication assistance [facility].  [facility] with explanation of [facility] benefits.  Pt states he has been going to [facility], but may be interested in one of the [facility] clinics as he is paying $80 out of pocket, per visit.  [facility], but schedule full for the week.  Left information in patient appointments for pt to call clinic on Monday for PCP follow up.  Pt verbalizes understanding of instructions.    Quintella BatonJulie W. Franchelle Foskett, RN, BSN  Trauma/Neuro ICU Case Manager [phone]

## 2018-01-08 MED ORDER — AMOXICILLIN-POT CLAVULANATE 875-125 MG PO TABS: 1.0000 | ORAL_TABLET | Freq: Two times a day (BID) | ORAL | 0 refills | Status: DC

## 2018-01-08 NOTE — Discharge Instructions (Signed)
Percutaneous Abscess Drain, Care After °This sheet gives you information about how to care for yourself after your procedure. Your health care provider may also give you more specific instructions. If you have problems or questions, contact your health care provider. °What can I expect after the procedure? °After your procedure, it is common to have: °· A small amount of bruising and discomfort in the area where the drainage tube (catheter) was placed. °· Sleepiness and fatigue. This should go away after the medicines you were given have worn off. ° °Follow these instructions at home: °Incision care °· Follow instructions from your health care provider about how to take care of your incision. Make sure you: °? Wash your hands with soap and water before you change your bandage (dressing). If soap and water are not available, use hand sanitizer. °? Change your dressing as told by your health care provider. °? Leave stitches (sutures), skin glue, or adhesive strips in place. These skin closures may need to stay in place for 2 weeks or longer. If adhesive strip edges start to loosen and curl up, you may trim the loose edges. Do not remove adhesive strips completely unless your health care provider tells you to do that. °· Check your incision area every day for signs of infection. Check for: °? More redness, swelling, or pain. °? More fluid or blood. °? Warmth. °? Pus or a bad smell. °? Fluid leaking from around your catheter (instead of fluid draining through your catheter). °Catheter care °· Follow instructions from your health care provider about emptying and cleaning your catheter and collection bag. You may need to clean the catheter every day so it does not clog. °· If directed, write down the following information every time you empty your bag: °? The date and time. °? The amount of drainage. °General instructions °· Rest at home for 1-2 days after your procedure. Return to your normal activities as told by your  health care provider. °· Do not take baths, swim, or use a hot tub for 24 hours after your procedure, or until your health care provider says that this is okay. °· Take over-the-counter and prescription medicines only as told by your health care provider. °· Keep all follow-up visits as told by your health care provider. This is important. °Contact a health care provider if: °· You have less than 10 mL of drainage a day for 2-3 days in a row, or as directed by your health care provider. °· You have more redness, swelling, or pain around your incision area. °· You have more fluid or blood coming from your incision area. °· Your incision area feels warm to the touch. °· You have pus or a bad smell coming from your incision area. °· You have fluid leaking from around your catheter (instead of through your catheter). °· You have a fever or chills. °· You have pain that does not get better with medicine. °Get help right away if: °· Your catheter comes out. °· You suddenly stop having drainage from your catheter. °· You suddenly have blood in the fluid that is draining from your catheter. °· You become dizzy or you faint. °· You develop a rash. °· You have nausea or vomiting. °· You have difficulty breathing or you feel short of breath. °· You develop chest pain. °· You have problems with your speech or vision. °· You have trouble balancing or moving your arms or legs. °Summary °· It is common to have a small   amount of bruising and discomfort in the area where the drainage tube (catheter) was placed.  You may be directed to record the amount of drainage from the bag every time you empty it.  Follow instructions from your health care provider about emptying and cleaning your catheter and collection bag. This information is not intended to replace advice given to you by your health care provider. Make sure you discuss any questions you have with your health care provider. Document Released: 02/26/2014 Document  Revised: 09/03/2016 Document Reviewed: 09/03/2016 Elsevier Interactive Patient Education  2017 [employer] Inc[employer]    POST OP INSTRUCTIONS  1. DIET: High in fiber. Be sure to include lots of fluids daily.  Avoid fast food or heavy meals as your are more likely to get nauseated.   2. Take your usually prescribed home medications unless otherwise directed.  3. Medications a. It is helpful to take an over-the-counter pain medication for any abdominal discomfort as needed b. Take antibiotics as prescribed  4. Avoid getting constipated. Increasing fluid intake and taking a fiber supplement (such as Metamucil, Citrucel, FiberCon, MiraLax, etc) 1-2 times a day regularly will usually help prevent this problem from occurring.  A mild laxative (prune juice, Milk of Magnesia, MiraLax, etc) should be taken according to package directions if there are no bowel movements after 48 hours.    5. Dressing: As per interventional radiology discharge instructions above  6. ACTIVITIES as tolerated:   a. DO NOT PUSH THROUGH PAIN.  Let pain be your guide: If it hurts to do something, don't do it. b. You may drive when you are able to comfortably wear a seatbelt, and you can safely maneuver your car and apply brakes. c. You may have sexual intercourse when it is comfortable.   7. FOLLOW UP in our office a. Please call [facility] at [phone] to set up an appointment to see your surgeon in the office for a follow-up appointment approximately 2 weeks from discharge b. Make sure that you call for this appointment the day you arrive home to insure a convenient appointment time.  9. If you have disability or family leave forms that need to be completed, you may have them completed by your primary care physician's office; for return to work instructions, please ask our office staff and they will be happy to assist you in obtaining this documentation   When to call us [phone]: 1. Poor pain control 2. Reactions /  problems with new medications (rash/itching, etc)  3. Fever over 101.5 F (38.5 C) 4. Inability to urinate 5. Nausea/vomiting 6. Worsening swelling or bruising 7. Continued bleeding from incision. 8. Increased pain, redness, or drainage from the incision  The clinic staff is available to answer your questions during regular business hours (8:30am-5pm).  Please dont hesitate to call and ask to speak to one of our nurses for clinical concerns.   A surgeon from [facility] is always on call at the hospitals   If you have a medical emergency, go to the nearest emergency room or call 911.  [facility] [address], [address], [place], [place]  [address] MAIN: [phone] FAX: [phone] [email or web address]

## 2018-01-08 NOTE — Discharge Summary (Addendum)
Patient ID: Robert Chen MRN: [id number] DOB/AGE: 05/21/1986 32 y.o.  Admit date: 01/04/2018 Discharge date: 01/09/2018  Discharge Diagnoses Patient Active Problem List   Diagnosis Date Noted  . Dysuria 01/04/2018  . Lower abdominal pain 01/04/2018  . Colonic diverticular abscess 01/04/2018  . Right lower quadrant abdominal pain 12/28/2017  . Obesity 03/12/2017  . Uses Spanish as primary spoken language 02/20/2017  . Sinus tachycardia 02/20/2017  . Hypertension   . Sigmoid diverticulitis with small perforatrion 02/19/2017    Consultants IR for percutaneous drain placement  Procedures IR drain placement  Hospital Course: Admitted with complicated sigmoid diverticulitis with abscess and urinary pain. He was started on IV abx, made NPO  And IR was consulted. A percutaneous drain was placed without issue. His abdominal pain resolved, WBC normalized and he was afebrile. His diet was gently advanced. On 3/15 he tolerated full liquids without n/v and was converted to PO Augmentin. On 3/16 his diet was advanced which he tolerated. He was deemed stable for discharge home on PO augmentin.  Allergies as of 01/09/2018   No Known Allergies     Medication List    TAKE these medications   amoxicillin-clavulanate 875-125 MG tablet Commonly known as:  AUGMENTIN Take 1 tablet by mouth every 12 (twelve) hours for 7 days.   Saline Flush 0.9 % Soln Inject 10 mLs into the vein daily. FLush abdominal drain with 5cc of NS daily.  May use 1 10cc syringe for 2 days   TYLENOL 500 MG tablet Generic drug:  acetaminophen Take 1,000 mg by mouth every 6 (six) hours as needed for headache.        Follow-up Information    Berdine DanceShick, Michael, MD Follow up in 10 day(s).   Specialties:  Interventional Radiology, Radiology Why:  pt will hear from scheduler for time and date Follow up CT and drain injection; call [phone] if any questions or needs Contact information: [address] [place] [address] [phone]        [facility]. Call on 01/10/2018.   Why:  CALL MONDAY FOR PCP APPOINTMENT Contact information: [address] [place] [phone]       Andria MeuseWhite, Christopher M, MD. Call.   Specialty:  General Surgery Why:  Call to confirm appointment date and time. Please arrive 30 min prior to appointment time. Bring photo ID and insurance information.  Contact information: [address] [place] [address] [phone]        [facility] Follow up.   Specialty:  Gastroenterology Why:  Call to schedule appointment for colonoscopy in 6-8 weeks.  Contact information: [address] [place] [phone]          Stephanie Couphristopher M. Cliffton AstersWhite, M.D. [facility]

## 2018-01-09 LAB — CULTURE, BLOOD (ROUTINE X 2)
Culture: NO GROWTH
Culture: NO GROWTH
Special Requests: ADEQUATE
Special Requests: ADEQUATE

## 2018-01-09 MED ORDER — SALINE FLUSH 0.9 % IV SOLN: 10.0000 mL | Freq: Every day | INTRAVENOUS | 0 refills | Status: DC

## 2018-01-09 NOTE — Progress Notes (Signed)
Patient is tolerating a regular diet today.  Please see DC summary done yesterday.  No new complaints.  Flushes will be provided for the patient to flush his drain.  He is stable for DC home.  Robert Chen 10:14 AM 01/09/2018

## 2018-01-10 ENCOUNTER — Encounter (HOSPITAL_COMMUNITY): Payer: Self-pay | Admitting: Emergency Medicine

## 2018-01-10 ENCOUNTER — Encounter: Payer: Self-pay | Admitting: Family Medicine

## 2018-01-10 ENCOUNTER — Other Ambulatory Visit: Payer: Self-pay | Admitting: Surgery

## 2018-01-10 ENCOUNTER — Other Ambulatory Visit: Payer: Self-pay

## 2018-01-10 DIAGNOSIS — K5720 Diverticulitis of large intestine with perforation and abscess without bleeding: Secondary | ICD-10-CM | POA: Insufficient documentation

## 2018-01-10 DIAGNOSIS — Z79899 Other long term (current) drug therapy: Secondary | ICD-10-CM | POA: Insufficient documentation

## 2018-01-10 DIAGNOSIS — R310 Gross hematuria: Secondary | ICD-10-CM | POA: Insufficient documentation

## 2018-01-10 DIAGNOSIS — I10 Essential (primary) hypertension: Secondary | ICD-10-CM | POA: Insufficient documentation

## 2018-01-10 LAB — AEROBIC/ANAEROBIC CULTURE W GRAM STAIN (SURGICAL/DEEP WOUND): Special Requests: NORMAL

## 2018-01-10 LAB — AEROBIC/ANAEROBIC CULTURE (SURGICAL/DEEP WOUND)

## 2018-01-10 NOTE — ED Triage Notes (Signed)
Pt reports having blood in urine this evening. Denies but feels "itchy" Pt d/c yesterday with abd drain d/t pelvic abscess.  Pt denies fever, denies pain

## 2018-01-11 ENCOUNTER — Emergency Department (HOSPITAL_COMMUNITY)
Admission: EM | Admit: 2018-01-11 | Discharge: 2018-01-11 | Disposition: A | Discharge status: Home / Self Care | Payer: Self-pay | Attending: Emergency Medicine | Admitting: Emergency Medicine

## 2018-01-11 ENCOUNTER — Emergency Department (HOSPITAL_COMMUNITY): Payer: Self-pay

## 2018-01-11 DIAGNOSIS — R310 Gross hematuria: Secondary | ICD-10-CM

## 2018-01-11 DIAGNOSIS — K5720 Diverticulitis of large intestine with perforation and abscess without bleeding: Secondary | ICD-10-CM

## 2018-01-11 LAB — CBC WITH DIFFERENTIAL/PLATELET
BASOS ABS: 0 10*3/uL (ref 0.0–0.1)
Basophils Relative: 0 %
EOS PCT: 1 %
Eosinophils Absolute: 0.1 10*3/uL (ref 0.0–0.7)
HCT: 35.2 % — ABNORMAL LOW (ref 39.0–52.0)
Hemoglobin: 11.1 g/dL — ABNORMAL LOW (ref 13.0–17.0)
Lymphocytes Relative: 27 %
Lymphs Abs: 2.3 10*3/uL (ref 0.7–4.0)
MCH: 24.3 pg — ABNORMAL LOW (ref 26.0–34.0)
MCHC: 31.5 g/dL (ref 30.0–36.0)
MCV: 77 fL — ABNORMAL LOW (ref 78.0–100.0)
Monocytes Absolute: 0.5 10*3/uL (ref 0.1–1.0)
Monocytes Relative: 5 %
Neutro Abs: 5.8 10*3/uL (ref 1.7–7.7)
Neutrophils Relative %: 67 %
PLATELETS: 359 10*3/uL (ref 150–400)
RBC: 4.57 MIL/uL (ref 4.22–5.81)
RDW: 16.4 % — ABNORMAL HIGH (ref 11.5–15.5)
WBC: 8.7 10*3/uL (ref 4.0–10.5)

## 2018-01-11 LAB — BASIC METABOLIC PANEL
ANION GAP: 11 (ref 5–15)
BUN: 8 mg/dL (ref 6–20)
CO2: 24 mmol/L (ref 22–32)
Calcium: 9.1 mg/dL (ref 8.9–10.3)
Chloride: 102 mmol/L (ref 101–111)
Creatinine, Ser: 0.95 mg/dL (ref 0.61–1.24)
Glucose, Bld: 102 mg/dL — ABNORMAL HIGH (ref 65–99)
POTASSIUM: 3.5 mmol/L (ref 3.5–5.1)
Sodium: 137 mmol/L (ref 135–145)

## 2018-01-11 LAB — URINALYSIS, ROUTINE W REFLEX MICROSCOPIC
Bacteria, UA: NONE SEEN
Squamous Epithelial / LPF: NONE SEEN
WBC UA: NONE SEEN WBC/hpf (ref 0–5)

## 2018-01-11 MED ORDER — SODIUM CHLORIDE 0.9 % IV BOLUS (SEPSIS)
1000.0000 mL | Freq: Once | INTRAVENOUS | Status: AC
  Administered 2018-01-11: 1000 mL via INTRAVENOUS

## 2018-01-11 MED ORDER — IOPAMIDOL (ISOVUE-300) INJECTION 61%
INTRAVENOUS | Status: AC
  Administered 2018-01-11: 100 mL
  Filled 2018-01-11: qty 100

## 2018-01-11 NOTE — ED Provider Notes (Signed)
Received in signout from Dr. Wilkie AyeHorton, Briefly patient is 32 yo M recently discharged post diverticulitis with perforation and abscess.  Drain in place now with hematuria.  CT to eval for fistual/worsening of disease.  On augmentin.   CT scan with localized inflammation about the fluid collection along the dome of the bladder.  Likely this is the source of the patient's hematuria.  There is no fistula no worsening of the fluid collection.  Patient was reassessed without abdominal pain.  Continued to have drainage through his JP drain.  We will have him follow-up with general surgery.   Melene PlanFloyd, Robert Morency, DO 01/11/18 [phone]

## 2018-01-11 NOTE — ED Notes (Signed)
Horton, MD showed urine prior to sending it down.

## 2018-01-11 NOTE — ED Provider Notes (Signed)
[facility] EMERGENCY DEPARTMENT Provider Note   CSN: [id number] Arrival date & time: 01/10/18  2253     History   Chief Complaint Chief Complaint  Patient presents with  . Hematuria    HPI Robert Chen is a 32 y.o. male.  HPI  This is a 33 year old male with a recent history of diverticulitis with sigmoid abscess and drain placement who presents with hematuria.  Patient was discharged from the hospital yesterday.  He has a drain in place.  Denies any fevers.  He is taking Augmentin as prescribed.  Patient reports he noted onset of gross bloody urine yesterday.  Denies any dysuria.  Denies any fevers or back pain.  He states that he noted urine prior to his diagnosis of diverticulitis but this improved while in the hospital.  He denies any increased abdominal pain.  I have reviewed his prior hospitalization.  CT scan showed a 3.7 cm abscess which was inseparable from the right anterior bladder wall but there is no obvious fistula formation at that time.  Past Medical History:  Diagnosis Date  . Diverticulitis   . Hypertension     Patient Active Problem List   Diagnosis Date Noted  . Dysuria 01/04/2018  . Lower abdominal pain 01/04/2018  . Colonic diverticular abscess 01/04/2018  . Right lower quadrant abdominal pain 12/28/2017  . Obesity 03/12/2017  . Uses Spanish as primary spoken language 02/20/2017  . Sinus tachycardia 02/20/2017  . Hypertension   . Sigmoid diverticulitis with small perforatrion 02/19/2017    Past Surgical History:  Procedure Laterality Date  . NO PAST SURGERIES         Home Medications    Prior to Admission medications   Medication Sig Start Date End Date Taking? Authorizing Provider  acetaminophen (TYLENOL) 500 MG tablet Take 1,000 mg by mouth every 6 (six) hours as needed for headache.   Yes [provider]  amoxicillin-clavulanate (AUGMENTIN) 875-125 MG tablet Take 1 tablet by mouth every 12 (twelve) hours  for 7 days. 01/08/18 01/15/18 Yes Andria Meuse, MD  Sodium Chloride Flush (SALINE FLUSH) 0.9 % SOLN Inject 10 mLs into the vein daily. FLush abdominal drain with 5cc of NS daily.  May use 1 10cc syringe for 2 days 01/09/18  Yes Barnetta Chapel, PA-C    Family History Family History  Problem Relation Age of Onset  . Diabetes Father     Social History Social History   Tobacco Use  . Smoking status: Never Smoker  . Smokeless tobacco: Never Used  Substance Use Topics  . Alcohol use: No  . Drug use: No     Allergies   Patient has no known allergies.   Review of Systems Review of Systems  Constitutional: Negative for fever.  Respiratory: Negative for shortness of breath.   Cardiovascular: Negative for chest pain.  Gastrointestinal: Negative for abdominal pain, nausea and vomiting.  Genitourinary: Positive for hematuria. Negative for dysuria.  Musculoskeletal: Negative for back pain.  All other systems reviewed and are negative.    Physical Exam Updated Vital Signs BP 125/75   Pulse 62   Temp 98.1 F (36.7 C)   Resp 16   Ht 5\' 8"  (1.727 m)   Wt 92.1 kg (203 lb)   SpO2 100%   BMI 30.87 kg/m   Physical Exam  Constitutional: He is oriented to person, place, and time. He appears well-developed and well-nourished. No distress.  HENT:  Head: Normocephalic and atraumatic.  Cardiovascular: Normal rate,  regular rhythm and normal heart sounds.  No murmur heard. Pulmonary/Chest: Effort normal and breath sounds normal. No respiratory distress. He has no wheezes.  Abdominal: Soft. Bowel sounds are normal. There is tenderness. There is no rebound.  Tenderness over the lower abdominal region, no rebound or guarding, no signs of peritonitis, drain in place with purulent drainage noted  Neurological: He is alert and oriented to person, place, and time.  Skin: Skin is warm and dry.  Psychiatric: He has a normal mood and affect.  Nursing note and vitals reviewed.    ED  Treatments / Results  Labs (all labs ordered are listed, but only abnormal results are displayed) Labs Reviewed  URINALYSIS, ROUTINE W REFLEX MICROSCOPIC - Abnormal; Notable for the following components:      Result Value   Color, Urine RED (*)    APPearance CLOUDY (*)    Glucose, UA   (*)    Value: TEST NOT REPORTED DUE TO COLOR INTERFERENCE OF URINE PIGMENT   Hgb urine dipstick   (*)    Value: TEST NOT REPORTED DUE TO COLOR INTERFERENCE OF URINE PIGMENT   Bilirubin Urine   (*)    Value: TEST NOT REPORTED DUE TO COLOR INTERFERENCE OF URINE PIGMENT   Ketones, ur   (*)    Value: TEST NOT REPORTED DUE TO COLOR INTERFERENCE OF URINE PIGMENT   Protein, ur   (*)    Value: TEST NOT REPORTED DUE TO COLOR INTERFERENCE OF URINE PIGMENT   Nitrite   (*)    Value: TEST NOT REPORTED DUE TO COLOR INTERFERENCE OF URINE PIGMENT   Leukocytes, UA   (*)    Value: TEST NOT REPORTED DUE TO COLOR INTERFERENCE OF URINE PIGMENT   All other components within normal limits  CBC WITH DIFFERENTIAL/PLATELET - Abnormal; Notable for the following components:   Hemoglobin 11.1 (*)    HCT 35.2 (*)    MCV 77.0 (*)    MCH 24.3 (*)    RDW 16.4 (*)    All other components within normal limits  BASIC METABOLIC PANEL - Abnormal; Notable for the following components:   Glucose, Bld 102 (*)    All other components within normal limits  URINE CULTURE    EKG  EKG Interpretation None       Radiology No results found.  Procedures Procedures (including critical care time)  Medications Ordered in ED Medications  sodium chloride 0.9 % bolus 1,000 mL (1,000 mLs Intravenous New Bag/Given 01/11/18 0650)     Initial Impression / Assessment and Plan / ED Course  I have reviewed the triage vital signs and the nursing notes.  Pertinent labs & imaging results that were available during my care of the patient were reviewed by me and considered in my medical decision making (see chart for details).     She  presents with onset hematuria after being discharged.  I have reviewed his hospitalization and his labs.  He denies any increasing pain and his vital signs are reassuring.  No fevers.  The abscess was inseparable from the bladder wall.  There is no evidence of fistula initially.  However, given onset of hematuria this would be a concern.  Lab work obtained.  No worsening leukocytosis.  Repeat CT scan ordered.  Patient signed out to Dr. Adela LankFloyd pending CT.  Final Clinical Impressions(s) / ED Diagnoses   Final diagnoses:  Gross hematuria    ED Discharge Orders    None  Shon Baton, MD 01/11/18 [phone]

## 2018-01-11 NOTE — ED Notes (Signed)
ED Provider at bedside. 

## 2018-01-11 NOTE — ED Notes (Signed)
Declined W/C at D/C and was escorted to lobby by RN. 

## 2018-01-11 NOTE — ED Notes (Signed)
Pt has pelvic drain from abcess, was told to return if started urinated blood

## 2018-01-11 NOTE — Discharge Instructions (Signed)
Follow up with your surgeon as needed.  Return for worsening symptoms, fever, inability to urinate.

## 2018-01-11 NOTE — ED Notes (Signed)
Returned from CT scan.

## 2018-01-13 LAB — URINE CULTURE

## 2018-01-14 ENCOUNTER — Telehealth: Payer: Self-pay | Admitting: *Deleted

## 2018-01-14 ENCOUNTER — Ambulatory Visit (INDEPENDENT_AMBULATORY_CARE_PROVIDER_SITE_OTHER): Payer: Self-pay | Admitting: Nurse Practitioner

## 2018-01-14 ENCOUNTER — Encounter: Payer: Self-pay | Admitting: Nurse Practitioner

## 2018-01-14 VITALS — BP 124/80 | HR 82 | Temp 98.2°F | Resp 16 | Ht 68.0 in | Wt 200.8 lb

## 2018-01-14 DIAGNOSIS — K5720 Diverticulitis of large intestine with perforation and abscess without bleeding: Secondary | ICD-10-CM

## 2018-01-14 NOTE — Patient Instructions (Addendum)
Please return in about 3 months for your full annual physical with fasting labwork.  Please follow up in the meantime for any fevers > 101, chills, worsening abdominal pain, if you can not urinate, or if you develop nausea, vomiting or bowel changes.  It was nice to meet you. Thanks for letting me take care of you today :)

## 2018-01-14 NOTE — Progress Notes (Deleted)
Name: Robert Chen   MRN: [id number]    DOB: 06-05-1986   Date:01/14/2018       Progress Note  Subjective  Chief Complaint  Chief Complaint  Patient presents with  . Establish Care    CPE, fasting    HPI  Patient presents for annual CPE. He is currently under treatment by [facility] for diverticular abscess with an ED visit on 3/19 for gross hematuria which was attributed to this infection per CT scan results. He has a JP  Drain in place which he cares for at home and is taking daily augmentin, with plans for surgery upcoming in May  USPSTF grade A and B recommendations:  Diet: currently on low fiber diet due to abdominal infection Exercise: not currently due to abdominal infection  Depression: No concerns for anxiety or depression  Depression screen [facility] 2/9 09/07/2017  Decreased Interest 0  Down, Depressed, Hopeless 0  PHQ - 2 Score 0   Hypertension: BP Readings from Last 3 Encounters:  01/14/18 124/80  01/11/18 125/73  01/09/18 131/74   Obesity: Wt Readings from Last 3 Encounters:  01/14/18 200 lb 12.8 oz (91.1 kg)  01/10/18 203 lb (92.1 kg)  01/05/18 203 lb 7.8 oz (92.3 kg)   BMI Readings from Last 3 Encounters:  01/14/18 30.53 kg/m  01/10/18 30.87 kg/m  01/05/18 30.94 kg/m     Lipids:  No results found for: CHOL No results found for: HDL No results found for: LDLCALC No results found for: TRIG No results found for: CHOLHDL No results found for: LDLDIRECT Glucose:  Glucose, Bld  Date Value Ref Range Status  01/11/2018 102 (H) 65 - 99 mg/dL Final  01/06/2018 81 65 - 99 mg/dL Final  01/04/2018 101 (H) 65 - 99 mg/dL Final   Alcohol: no Tobacco use: no, never  Married are you monogamous?  STD testing and prevention (chl/gon/syphilis):  HIV, hep C: ***We will test today/declines-once in lifetime or annually if not monogamous? Once for hep c with baby boomers- it is recommended that we check.  Skin cancer: NO  Colorectal cancer: ***Personal or family  history? Bloody, dark,tarry stools? Changes in bowel movement? At age 19 order colonoscopy. If 40 or over, refer to GI. If under 40-hemocult if blood-refer to GI if +; miralax if constipated.  Changes in urine color  Aspirin: not indicated ECG:  Not indicated  Vaccinations: declines influenza, TDAP today  Advanced Care Planning: A voluntary discussion about advance care planning including the explanation and discussion of advance directives.  Discussed health care proxy and Living will, and the patient DOES NOT have a living will at present time. If patient does have living will, I have requested they bring this to the clinic to be scanned in to their chart.  Patient Active Problem List   Diagnosis Date Noted  . Dysuria 01/04/2018  . Lower abdominal pain 01/04/2018  . Colonic diverticular abscess 01/04/2018  . Right lower quadrant abdominal pain 12/28/2017  . Obesity 03/12/2017  . Uses Spanish as primary spoken language 02/20/2017  . Sinus tachycardia 02/20/2017  . Hypertension   . Sigmoid diverticulitis with small perforatrion 02/19/2017    Past Surgical History:  Procedure Laterality Date  . NO PAST SURGERIES      Family History  Problem Relation Age of Onset  . Diabetes Father     Social History   Socioeconomic History  . Marital status: Married    Spouse name: Not on file  . Number of children: 1  .  Years of education: Not on file  . Highest education level: Not on file  Occupational History  . Occupation: Barista  . Financial resource strain: Not on file  . Food insecurity:    Worry: Not on file    Inability: Not on file  . Transportation needs:    Medical: Not on file    Non-medical: Not on file  Tobacco Use  . Smoking status: Never Smoker  . Smokeless tobacco: Never Used  Substance and Sexual Activity  . Alcohol use: No  . Drug use: No  . Sexual activity: Not on file  Lifestyle  . Physical activity:    Days per week: Not on file     Minutes per session: Not on file  . Stress: Not on file  Relationships  . Social connections:    Talks on phone: Not on file    Gets together: Not on file    Attends religious service: Not on file    Active member of club or organization: Not on file    Attends meetings of clubs or organizations: Not on file    Relationship status: Not on file  . Intimate partner violence:    Fear of current or ex partner: Not on file    Emotionally abused: Not on file    Physically abused: Not on file    Forced sexual activity: Not on file  Other Topics Concern  . Not on file  Social History Narrative   Fun/Hobby: Soccer     Current Outpatient Medications:  .  acetaminophen (TYLENOL) 500 MG tablet, Take 1,000 mg by mouth every 6 (six) hours as needed for headache., Disp: , Rfl:  .  amoxicillin-clavulanate (AUGMENTIN) 875-125 MG tablet, Take 1 tablet by mouth every 12 (twelve) hours., Disp: , Rfl:  .  Sodium Chloride Flush (SALINE FLUSH) 0.9 % SOLN, Inject 10 mLs into the vein daily. FLush abdominal drain with 5cc of NS daily.  May use 1 10cc syringe for 2 days, Disp: 5 Syringe, Rfl: 0  No Known Allergies   ROS  *** Constitutional: Negative for fever or weight change.  Respiratory: Negative for cough and shortness of breath.   Cardiovascular: Negative for chest pain or palpitations.  Gastrointestinal: Negative bowel changes, rectal bleeding.  Musculoskeletal: Negative for gait problem or joint swelling.  Genitourinary: positive for urine color change, negative for dysuria, frequency Skin: Negative for rash.  Neurological: Negative for dizziness or headache.  No other specific complaints in a complete review of systems (except as listed in HPI above).   Objective  Vitals:   01/14/18 1352  BP: 124/80  Pulse: 82  Resp: 16  Temp: 98.2 F (36.8 C)  TempSrc: Oral  SpO2: 98%  Weight: 200 lb 12.8 oz (91.1 kg)  Height: '5\' 8"'$  (1.727 m)    Body mass index is 30.53  kg/m.  Physical Exam *** Constitutional: Patient appears well-developed and well-nourished. No distress.  HENT: Head: Normocephalic and atraumatic. Ears: B TMs ok, no erythema or effusion; Nose: Nose normal. Mouth/Throat: Oropharynx is clear and moist. No oropharyngeal exudate.  Eyes: Conjunctivae and EOM are normal. Pupils are equal, round, and reactive to light. No scleral icterus.  Neck: Normal range of motion. Neck supple. No JVD present. No thyromegaly present.  Cardiovascular: Normal rate, regular rhythm and normal heart sounds.  No murmur heard. No BLE edema. Pulmonary/Chest: Effort normal and breath sounds normal. No respiratory distress. Abdominal: Soft. Bowel sounds are normal, no distension. There is  no tenderness. no masses MALE GENITALIA: Normal descended testes bilaterally, no masses palpated, no hernias, no lesions, no discharge RECTAL: Prostate normal size and consistency, no rectal masses or hemorrhoids Musculoskeletal: Normal range of motion, no joint effusions. No gross deformities Neurological: he is alert and oriented to person, place, and time. No cranial nerve deficit. Coordination, balance, strength, speech and gait are normal.  Skin: Skin is warm and dry. No rash noted. No erythema.  Psychiatric: Patient has a normal mood and affect. behavior is normal. Judgment and thought content normal.  Recent Results (from the past 2160 hour(s))  CBC w/Diff     Status: Abnormal   Collection Time: 12/28/17 11:57 AM  Result Value Ref Range   WBC 6.3 4.0 - 10.5 K/uL   RBC 5.54 4.22 - 5.81 Mil/uL   Hemoglobin 13.4 13.0 - 17.0 g/dL   HCT 41.9 39.0 - 52.0 %   MCV 75.6 (L) 78.0 - 100.0 fl   MCHC 31.9 30.0 - 36.0 g/dL   RDW 16.8 (H) 11.5 - 15.5 %   Platelets 385.0 150.0 - 400.0 K/uL   Neutrophils Relative % 63.6 43.0 - 77.0 %   Lymphocytes Relative 29.0 12.0 - 46.0 %   Monocytes Relative 6.2 3.0 - 12.0 %   Eosinophils Relative 0.8 0.0 - 5.0 %   Basophils Relative 0.4 0.0 - 3.0  %   Neutro Abs 4.0 1.4 - 7.7 K/uL   Lymphs Abs 1.8 0.7 - 4.0 K/uL   Monocytes Absolute 0.4 0.1 - 1.0 K/uL   Eosinophils Absolute 0.1 0.0 - 0.7 K/uL   Basophils Absolute 0.0 0.0 - 0.1 K/uL  C-reactive protein     Status: None   Collection Time: 12/28/17 11:57 AM  Result Value Ref Range   CRP 1.0 0.5 - 20.0 mg/dL  Comp Met (CMET)     Status: Abnormal   Collection Time: 12/28/17 11:57 AM  Result Value Ref Range   Sodium 139 135 - 145 mEq/L   Potassium 4.0 3.5 - 5.1 mEq/L   Chloride 105 96 - 112 mEq/L   CO2 27 19 - 32 mEq/L   Glucose, Bld 101 (H) 70 - 99 mg/dL   BUN 13 6 - 23 mg/dL   Creatinine, Ser 0.98 0.40 - 1.50 mg/dL   Total Bilirubin 0.5 0.2 - 1.2 mg/dL   Alkaline Phosphatase 100 39 - 117 U/L   AST 15 0 - 37 U/L   ALT 16 0 - 53 U/L   Total Protein 8.5 (H) 6.0 - 8.3 g/dL   Albumin 4.4 3.5 - 5.2 g/dL   Calcium 9.7 8.4 - 10.5 mg/dL   GFR 93.18 >60.00 mL/min  Urinalysis, Routine w reflex microscopic     Status: Abnormal   Collection Time: 12/28/17 11:57 AM  Result Value Ref Range   Color, Urine YELLOW Yellow;Lt. Yellow   APPearance CLEAR Clear   Specific Gravity, Urine 1.020 1.000 - 1.030   pH 6.5 5.0 - 8.0   Total Protein, Urine NEGATIVE Negative   Urine Glucose NEGATIVE Negative   Ketones, ur NEGATIVE Negative   Bilirubin Urine NEGATIVE Negative   Hgb urine dipstick TRACE-INTACT (A) Negative   Urobilinogen, UA 0.2 0.0 - 1.0   Leukocytes, UA TRACE (A) Negative   Nitrite NEGATIVE Negative   WBC, UA 3-6/hpf (A) 0-2/hpf   RBC / HPF 0-2/hpf 0-2/hpf   Mucus, UA Presence of (A) None   Squamous Epithelial / LPF Rare(0-4/hpf) Rare(0-4/hpf)  Urine Culture     Status: Abnormal  Collection Time: 01/04/18 10:02 AM  Result Value Ref Range   MICRO NUMBER: [id number]    SPECIMEN QUALITY: ADEQUATE    Sample Source URINE    STATUS: FINAL    ISOLATE 1: Streptococcus pyogenes (A)     Comment: Greater than 100,000 CFU/mL of Group A Streptococcus isolated Beta-hemolytic Streptococci  are predictably susceptible to penicillin and other beta-lactams. Susceptibility testing not routinely performed.  Urinalysis, Routine w reflex microscopic     Status: Abnormal   Collection Time: 01/04/18 10:02 AM  Result Value Ref Range   Color, Urine YELLOW Yellow;Lt. Yellow   APPearance Cloudy (A) Clear   Specific Gravity, Urine 1.025 1.000 - 1.030   pH 7.0 5.0 - 8.0   Total Protein, Urine TRACE (A) Negative   Urine Glucose NEGATIVE Negative   Ketones, ur NEGATIVE Negative   Bilirubin Urine NEGATIVE Negative   Hgb urine dipstick LARGE (A) Negative   Urobilinogen, UA 0.2 0.0 - 1.0   Leukocytes, UA MODERATE (A) Negative   Nitrite NEGATIVE Negative   WBC, UA TNTC(>50/hpf) (A) 0-2/hpf   RBC / HPF 21-50/hpf (A) 0-2/hpf   Mucus, UA Presence of (A) None   Squamous Epithelial / LPF Rare(0-4/hpf) Rare(0-4/hpf)  Blood culture (routine x 2)     Status: None   Collection Time: 01/04/18  1:45 PM  Result Value Ref Range   Specimen Description BLOOD RIGHT ANTECUBITAL    Special Requests      BOTTLES DRAWN AEROBIC AND ANAEROBIC Blood Culture adequate volume   Culture      NO GROWTH 5 DAYS Performed at [facility], [place] [address] [place], [place] [address]    Report Status 01/09/2018 FINAL   CBC with Differential     Status: Abnormal   Collection Time: 01/04/18  1:51 PM  Result Value Ref Range   WBC 11.5 (H) 4.0 - 10.5 K/uL   RBC 5.22 4.22 - 5.81 MIL/uL   Hemoglobin 12.8 (L) 13.0 - 17.0 g/dL   HCT 40.0 39.0 - 52.0 %   MCV 76.6 (L) 78.0 - 100.0 fL   MCH 24.5 (L) 26.0 - 34.0 pg   MCHC 32.0 30.0 - 36.0 g/dL   RDW 15.9 (H) 11.5 - 15.5 %   Platelets 356 150 - 400 K/uL   Neutrophils Relative % 78 %   Neutro Abs 9.0 (H) 1.7 - 7.7 K/uL   Lymphocytes Relative 18 %   Lymphs Abs 2.0 0.7 - 4.0 K/uL   Monocytes Relative 4 %   Monocytes Absolute 0.5 0.1 - 1.0 K/uL   Eosinophils Relative 0 %   Eosinophils Absolute 0.0 0.0 - 0.7 K/uL   Basophils Relative 0 %   Basophils Absolute 0.0 0.0  - 0.1 K/uL    Comment: Performed at [facility] [address], [place], [place] [address]  Comprehensive metabolic panel     Status: Abnormal   Collection Time: 01/04/18  1:51 PM  Result Value Ref Range   Sodium 137 135 - 145 mmol/L   Potassium 3.9 3.5 - 5.1 mmol/L   Chloride 104 101 - 111 mmol/L   CO2 23 22 - 32 mmol/L   Glucose, Bld 101 (H) 65 - 99 mg/dL   BUN 7 6 - 20 mg/dL   Creatinine, Ser 0.95 0.61 - 1.24 mg/dL   Calcium 9.2 8.9 - 10.3 mg/dL   Total Protein 8.5 (H) 6.5 - 8.1 g/dL   Albumin 4.0 3.5 - 5.0 g/dL   AST 17 15 -  41 U/L   ALT 18 17 - 63 U/L   Alkaline Phosphatase 95 38 - 126 U/L   Total Bilirubin 0.8 0.3 - 1.2 mg/dL   GFR calc non Af Amer >60 >60 mL/min   GFR calc Af Amer >60 >60 mL/min    Comment: (NOTE) The eGFR has been calculated using the CKD EPI equation. This calculation has not been validated in all clinical situations. eGFR's persistently <60 mL/min signify possible Chronic Kidney Disease.    Anion gap 10 5 - 15    Comment: Performed at [facility] [address], [place], [place] [address]  Blood culture (routine x 2)     Status: None   Collection Time: 01/04/18  5:47 PM  Result Value Ref Range   Specimen Description BLOOD LEFT ANTECUBITAL    Special Requests      BOTTLES DRAWN AEROBIC AND ANAEROBIC Blood Culture adequate volume   Culture      NO GROWTH 5 DAYS Performed at [facility], [place] [address], [place], [place] [address]    Report Status 01/09/2018 FINAL   Protime-INR     Status: None   Collection Time: 01/05/18  4:03 AM  Result Value Ref Range   Prothrombin Time 14.1 11.4 - 15.2 seconds   INR 1.10     Comment: Performed at [facility] [address] [place], [place] [address]  CBC     Status: Abnormal   Collection Time: 01/05/18  4:03 AM  Result Value Ref Range   WBC 10.6 (H) 4.0 - 10.5 K/uL   RBC 4.64 4.22 - 5.81 MIL/uL   Hemoglobin 11.2 (L) 13.0 - 17.0 g/dL   HCT 35.7 (L) 39.0 - 52.0 %   MCV 76.9  (L) 78.0 - 100.0 fL   MCH 24.1 (L) 26.0 - 34.0 pg   MCHC 31.4 30.0 - 36.0 g/dL   RDW 15.8 (H) 11.5 - 15.5 %   Platelets 325 150 - 400 K/uL    Comment: Performed at [facility], [place] [address], [place], [place] [address]  MRSA PCR Screening     Status: None   Collection Time: 01/05/18  6:18 AM  Result Value Ref Range   MRSA by PCR NEGATIVE NEGATIVE    Comment:        The GeneXpert MRSA Assay (FDA approved for NASAL specimens only), is one component of a comprehensive MRSA colonization surveillance program. It is not intended to diagnose MRSA infection nor to guide or monitor treatment for MRSA infections. Performed at [facility], [place] [address], [place], [place] [address]   Aerobic/Anaerobic Culture (surgical/deep wound)     Status: None   Collection Time: 01/05/18  5:19 PM  Result Value Ref Range   Specimen Description PELVIS    Special Requests Normal    Gram Stain      ABUNDANT WBC PRESENT, PREDOMINANTLY PMN MODERATE GRAM POSITIVE COCCI    Culture      MODERATE STAPHYLOCOCCUS AUREUS NO ANAEROBES ISOLATED Performed at [facility], [place] [address], [place], [place] [address]    Report Status 01/10/2018 FINAL    Organism ID, Bacteria STAPHYLOCOCCUS AUREUS       Susceptibility   Staphylococcus aureus - MIC*    CIPROFLOXACIN <=0.5 SENSITIVE Sensitive     ERYTHROMYCIN >=8 RESISTANT Resistant     GENTAMICIN <=0.5 SENSITIVE Sensitive     OXACILLIN 0.5 SENSITIVE Sensitive     TETRACYCLINE <=1 SENSITIVE  Sensitive     VANCOMYCIN 1 SENSITIVE Sensitive     TRIMETH/SULFA <=10 SENSITIVE Sensitive     CLINDAMYCIN <=0.25 SENSITIVE Sensitive     RIFAMPIN <=0.5 SENSITIVE Sensitive     Inducible Clindamycin NEGATIVE Sensitive     * MODERATE STAPHYLOCOCCUS AUREUS  CBC     Status: Abnormal   Collection Time: 01/06/18 10:53 AM  Result Value Ref Range   WBC 9.8 4.0 - 10.5 K/uL   RBC 5.18 4.22 - 5.81 MIL/uL   Hemoglobin 12.9 (L) 13.0 - 17.0 g/dL   HCT 40.2 39.0  - 52.0 %   MCV 77.6 (L) 78.0 - 100.0 fL   MCH 24.9 (L) 26.0 - 34.0 pg   MCHC 32.1 30.0 - 36.0 g/dL   RDW 15.8 (H) 11.5 - 15.5 %   Platelets 425 (H) 150 - 400 K/uL    Comment: Performed at [facility], [place] [address] [place], [place] [address]  Basic metabolic panel     Status: None   Collection Time: 01/06/18 10:53 AM  Result Value Ref Range   Sodium 138 135 - 145 mmol/L   Potassium 4.0 3.5 - 5.1 mmol/L   Chloride 104 101 - 111 mmol/L   CO2 22 22 - 32 mmol/L   Glucose, Bld 81 65 - 99 mg/dL   BUN 11 6 - 20 mg/dL   Creatinine, Ser 1.10 0.61 - 1.24 mg/dL   Calcium 9.0 8.9 - 10.3 mg/dL   GFR calc non Af Amer >60 >60 mL/min   GFR calc Af Amer >60 >60 mL/min    Comment: (NOTE) The eGFR has been calculated using the CKD EPI equation. This calculation has not been validated in all clinical situations. eGFR's persistently <60 mL/min signify possible Chronic Kidney Disease.    Anion gap 12 5 - 15    Comment: Performed at [facility] [address], [place], [place] [address]  Urinalysis, Routine w reflex microscopic- may I&O cath if menses     Status: Abnormal   Collection Time: 01/11/18  5:21 AM  Result Value Ref Range   Color, Urine RED (A) YELLOW   APPearance CLOUDY (A) CLEAR   Specific Gravity, Urine  1.005 - 1.030    TEST NOT REPORTED DUE TO COLOR INTERFERENCE OF URINE PIGMENT   pH  5.0 - 8.0    TEST NOT REPORTED DUE TO COLOR INTERFERENCE OF URINE PIGMENT   Glucose, UA (A) NEGATIVE mg/dL    TEST NOT REPORTED DUE TO COLOR INTERFERENCE OF URINE PIGMENT   Hgb urine dipstick (A) NEGATIVE    TEST NOT REPORTED DUE TO COLOR INTERFERENCE OF URINE PIGMENT   Bilirubin Urine (A) NEGATIVE    TEST NOT REPORTED DUE TO COLOR INTERFERENCE OF URINE PIGMENT   Ketones, ur (A) NEGATIVE mg/dL    TEST NOT REPORTED DUE TO COLOR INTERFERENCE OF URINE PIGMENT   Protein, ur (A) NEGATIVE mg/dL    TEST NOT REPORTED DUE TO COLOR INTERFERENCE OF URINE PIGMENT   Nitrite (A) NEGATIVE     TEST NOT REPORTED DUE TO COLOR INTERFERENCE OF URINE PIGMENT   Leukocytes, UA (A) NEGATIVE    TEST NOT REPORTED DUE TO COLOR INTERFERENCE OF URINE PIGMENT   RBC / HPF TOO NUMEROUS TO COUNT 0 - 5 RBC/hpf   WBC, UA NONE SEEN 0 - 5 WBC/hpf   Bacteria, UA NONE SEEN NONE SEEN   Squamous Epithelial / LPF NONE SEEN NONE SEEN    Comment: Performed at  [facility], [place] [address] [place], [place] [address]  Urine C&S     Status: Abnormal   Collection Time: 01/11/18  5:21 AM  Result Value Ref Range   Specimen Description URINE, CLEAN CATCH    Special Requests      NONE Performed at [facility], [place] [address], [place], [place] [address]    Culture 20,000 COLONIES/mL ESCHERICHIA COLI (A)    Report Status 01/13/2018 FINAL    Organism ID, Bacteria ESCHERICHIA COLI (A)       Susceptibility   Escherichia coli - MIC*    AMPICILLIN >=32 RESISTANT Resistant     CEFAZOLIN <=4 SENSITIVE Sensitive     CEFTRIAXONE <=1 SENSITIVE Sensitive     CIPROFLOXACIN <=0.25 SENSITIVE Sensitive     GENTAMICIN >=16 RESISTANT Resistant     IMIPENEM <=0.25 SENSITIVE Sensitive     NITROFURANTOIN <=16 SENSITIVE Sensitive     TRIMETH/SULFA >=320 RESISTANT Resistant     AMPICILLIN/SULBACTAM 16 INTERMEDIATE Intermediate     PIP/TAZO <=4 SENSITIVE Sensitive     Extended ESBL NEGATIVE Sensitive     * 20,000 COLONIES/mL ESCHERICHIA COLI  CBC with Differential     Status: Abnormal   Collection Time: 01/11/18  5:51 AM  Result Value Ref Range   WBC 8.7 4.0 - 10.5 K/uL   RBC 4.57 4.22 - 5.81 MIL/uL   Hemoglobin 11.1 (L) 13.0 - 17.0 g/dL   HCT 35.2 (L) 39.0 - 52.0 %   MCV 77.0 (L) 78.0 - 100.0 fL   MCH 24.3 (L) 26.0 - 34.0 pg   MCHC 31.5 30.0 - 36.0 g/dL   RDW 16.4 (H) 11.5 - 15.5 %   Platelets 359 150 - 400 K/uL   Neutrophils Relative % 67 %   Neutro Abs 5.8 1.7 - 7.7 K/uL   Lymphocytes Relative 27 %   Lymphs Abs 2.3 0.7 - 4.0 K/uL   Monocytes Relative 5 %   Monocytes Absolute 0.5 0.1 - 1.0 K/uL    Eosinophils Relative 1 %   Eosinophils Absolute 0.1 0.0 - 0.7 K/uL   Basophils Relative 0 %   Basophils Absolute 0.0 0.0 - 0.1 K/uL    Comment: Performed at [facility] [address], [place], [place] [address]  Basic metabolic panel     Status: Abnormal   Collection Time: 01/11/18  5:51 AM  Result Value Ref Range   Sodium 137 135 - 145 mmol/L   Potassium 3.5 3.5 - 5.1 mmol/L   Chloride 102 101 - 111 mmol/L   CO2 24 22 - 32 mmol/L   Glucose, Bld 102 (H) 65 - 99 mg/dL   BUN 8 6 - 20 mg/dL   Creatinine, Ser 0.95 0.61 - 1.24 mg/dL   Calcium 9.1 8.9 - 10.3 mg/dL   GFR calc non Af Amer >60 >60 mL/min   GFR calc Af Amer >60 >60 mL/min    Comment: (NOTE) The eGFR has been calculated using the CKD EPI equation. This calculation has not been validated in all clinical situations. eGFR's persistently <60 mL/min signify possible Chronic Kidney Disease.    Anion gap 11 5 - 15    Comment: Performed at [facility] [address] [place], [place] [address]    Diabetic Foot Exam: Diabetic Foot Exam - Simple   No data filed     ***  PHQ2/9: Depression screen [facility] 2/9 09/07/2017  Decreased Interest 0  Down, Depressed, Hopeless 0  PHQ -  2 Score 0   ***  Fall Risk: Fall Risk  09/07/2017  Falls in the past year? No   ***   Assessment & Plan  There are no diagnoses linked to this encounter.   -Prostate cancer screening and PSA options (with potential risks and benefits of testing vs not testing) were discussed along with recent recs/guidelines. -USPSTF grade A and B recommendations reviewed with patient; age-appropriate recommendations, preventive care, screening tests, etc discussed and encouraged; healthy living encouraged; see AVS for patient education given to patient -Discussed importance of 150 minutes of physical activity weekly, eat two servings of fish weekly, eat one serving of tree nuts ( cashews, pistachios, pecans, almonds.Marland Kitchen) every other day, eat 6  servings of fruit/vegetables daily and drink plenty of water and avoid sweet beverages.  -Red flags and when to present for emergency care or RTC including fever >101.42F, chest pain, shortness of breath, new/worsening/un-resolving symptoms, *** reviewed with patient at time of visit. Follow up and care instructions discussed and provided in AVS. DELETE IF NOT NEEDED -Reviewed Health Maintenance: ***what did we do in health maintenance  ***Return in 1-2 weeks for chronic problems to discuss labs and management- cholesterol, diabetes, thyroid, vitamin d , HTN

## 2018-01-14 NOTE — Telephone Encounter (Signed)
Post ED Visit - Positive Culture Follow-up  Culture report reviewed by antimicrobial stewardship pharmacist:  []  Enzo BiNathan Batchelder, Pharm.D. []  Celedonio MiyamotoJeremy Frens, Pharm.D., BCPS AQ-ID []  Garvin FilaMike Maccia, Pharm.D., BCPS [x]  Georgina PillionElizabeth Martin, Pharm.D., BCPS []  Ives EstatesMinh Pham, VermontPharm.D., BCPS, AAHIVP []  Estella HuskMichelle Turner, Pharm.D., BCPS, AAHIVP []  Lysle Pearlachel Rumbarger, PharmD, BCPS []  Blake DivineShannon Parkey, PharmD []  Pollyann SamplesAndy Johnston, PharmD, BCPS  Positive urine culture Treated with augmentin, organism sensitive to the same and no further patient follow-up is required at this time.  Virl AxeRobertson, Denym Rahimi [facility] 01/14/2018, 10:47 AM

## 2018-01-18 ENCOUNTER — Encounter: Payer: Self-pay | Admitting: Radiology

## 2018-01-18 ENCOUNTER — Ambulatory Visit
Admission: RE | Admit: 2018-01-18 | Discharge: 2018-01-18 | Disposition: A | Discharge status: Home / Self Care | Payer: Self-pay | Source: Ambulatory Visit | Attending: Radiology | Admitting: Radiology

## 2018-01-18 ENCOUNTER — Ambulatory Visit
Admission: RE | Admit: 2018-01-18 | Discharge: 2018-01-18 | Disposition: A | Discharge status: Home / Self Care | Payer: No Typology Code available for payment source | Source: Ambulatory Visit | Attending: Surgery | Admitting: Surgery

## 2018-01-18 ENCOUNTER — Other Ambulatory Visit: Payer: Self-pay | Admitting: Surgery

## 2018-01-18 ENCOUNTER — Ambulatory Visit
Admission: RE | Admit: 2018-01-18 | Discharge: 2018-01-18 | Disposition: A | Discharge status: Home / Self Care | Payer: Self-pay | Source: Ambulatory Visit | Attending: Surgery | Admitting: Surgery

## 2018-01-18 DIAGNOSIS — K5720 Diverticulitis of large intestine with perforation and abscess without bleeding: Secondary | ICD-10-CM

## 2018-01-18 HISTORY — PX: IR RADIOLOGIST EVAL & MGMT: IMG5224

## 2018-01-18 MED ORDER — IOPAMIDOL (ISOVUE-300) INJECTION 61%
125.0000 mL | Freq: Once | INTRAVENOUS | Status: AC | PRN
  Administered 2018-01-18: 125 mL via INTRAVENOUS

## 2018-01-18 NOTE — Progress Notes (Signed)
Chief Complaint: Diverticular abscess  Referring Physician(s): Emelia LoronWakefield, Matthew  Supervising Physician: Ruel FavorsShick, Trevor  History of Present Illness: Robert Chen is a 32 y.o. male who had admission last April for sigmoid diverticulitis which resolved conservatively.  He was doing well until the last couple weeks.   He developed increasing lower abdominal pain.  He underwent outpt scan that shows sigmoid diverticular disease with abscess and larger area of phlegmon.    It lies on the bladder which accounts for urinary symptoms.  He underwent placement of a drain 01/05/2018 by Dr. Miles CostainShick.  He is here today for follow up.  He has documented the output very well and brought in his sheet today. He continues to have about 15- 30 mL output daily.  Past Medical History:  Diagnosis Date  . Diverticulitis   . Hypertension     Past Surgical History:  Procedure Laterality Date  . IR RADIOLOGIST EVAL & MGMT  01/18/2018  . NO PAST SURGERIES      Allergies: Patient has no known allergies.  Medications: Prior to Admission medications   Medication Sig Start Date End Date Taking? Authorizing Provider  acetaminophen (TYLENOL) 500 MG tablet Take 1,000 mg by mouth every 6 (six) hours as needed for headache.    [provider]  amoxicillin-clavulanate (AUGMENTIN) 875-125 MG tablet Take 1 tablet by mouth every 12 (twelve) hours.    [provider]  Sodium Chloride Flush (SALINE FLUSH) 0.9 % SOLN Inject 10 mLs into the vein daily. FLush abdominal drain with 5cc of NS daily.  May use 1 10cc syringe for 2 days 01/09/18   Barnetta Chapelsborne, Kelly, PA-C     Family History  Problem Relation Age of Onset  . Diabetes Father     Social History   Socioeconomic History  . Marital status: Married    Spouse name: Not on file  . Number of children: 1  . Years of education: Not on file  . Highest education level: Not on file  Occupational History  . Occupation: Education officer, environmentalConstruction    Social Needs  . Financial resource strain: Not on file  . Food insecurity:    Worry: Not on file    Inability: Not on file  . Transportation needs:    Medical: Not on file    Non-medical: Not on file  Tobacco Use  . Smoking status: Never Smoker  . Smokeless tobacco: Never Used  Substance and Sexual Activity  . Alcohol use: No  . Drug use: No  . Sexual activity: Not on file  Lifestyle  . Physical activity:    Days per week: Not on file    Minutes per session: Not on file  . Stress: Not on file  Relationships  . Social connections:    Talks on phone: Not on file    Gets together: Not on file    Attends religious service: Not on file    Active member of club or organization: Not on file    Attends meetings of clubs or organizations: Not on file    Relationship status: Not on file  Other Topics Concern  . Not on file  Social History Narrative   Fun/Hobby: Soccer   Review of Systems  Vital Signs: BP (!) 144/81   Pulse 71   Temp 98 F (36.7 C)   SpO2 100%   Physical Exam Awake and alert NAD   Imaging: Ct Abdomen Pelvis W Contrast  Result Date: 01/18/2018 CLINICAL DATA:  Pelvic diverticular abscess, status  post percutaneous drain, outpatient follow-up EXAM: CT ABDOMEN AND PELVIS WITH CONTRAST TECHNIQUE: Multidetector CT imaging of the abdomen and pelvis was performed using the standard protocol following bolus administration of intravenous contrast. CONTRAST:  ISOVUE-300 IOPAMIDOL (ISOVUE-300) INJECTION 61% COMPARISON:  01/11/2018 FINDINGS: Lower chest: No acute abnormality. Hepatobiliary: No focal liver abnormality is seen. No gallstones, gallbladder wall thickening, or biliary dilatation. Pancreas: Unremarkable. No pancreatic ductal dilatation or surrounding inflammatory changes. Spleen: Normal in size without focal abnormality. Adrenals/Urinary Tract: Adrenal glands are unremarkable. Kidneys are normal, without renal calculi, focal lesion, or hydronephrosis.  Interval minimal improvement in the soft tissue thickening and inflammation along the right bladder dome where the drain catheter is located. Stomach/Bowel: Negative for bowel obstruction, significant dilatation, ileus, or free air. Normal appendix. No new fluid collection or abscess. Stable right lower quadrant pelvic abscess drain location. Resolved small abscess however there is residual pericolonic inflammation and soft tissue thickening along the right bladder dome. Vascular/Lymphatic: Negative for aneurysm. No significant vascular occlusive process. No adenopathy. Reproductive: Prostate unremarkable. Seminal vesicles symmetric. No acute finding by CT. Other: No inguinal or abdominal wall hernia.  No ascites. Musculoskeletal: No acute or significant osseous finding. IMPRESSION: Resolved anterior pelvic diverticular abscess at the drain catheter site however, there is still residual significant inflammation with phlegmonous reaction and soft tissue thickening along the right bladder dome. No new abscess or abdominopelvic fluid collection. Diffuse colonic diverticulosis Electronically Signed   By: Judie Petit.  Shick M.D.   On: 01/18/2018 13:06   Ct Abdomen Pelvis W Contrast  Result Date: 01/11/2018 CLINICAL DATA:  Followup diverticular abscess drain. Re- presentation with hematuria. EXAM: CT ABDOMEN AND PELVIS WITH CONTRAST TECHNIQUE: Multidetector CT imaging of the abdomen and pelvis was performed using the standard protocol following bolus administration of intravenous contrast. CONTRAST:  ISOVUE-300 IOPAMIDOL (ISOVUE-300) INJECTION 61% COMPARISON:  01/05/2018.  01/04/2018. FINDINGS: Lower chest: Normal Hepatobiliary: Normal Pancreas: Normal Spleen: Normal Adrenals/Urinary Tract: Adrenal glands are normal. Kidneys are normal. Anterior central pelvic diverticular abscess is in immediate contact with the dome of the bladder and there is bladder wall thickening, presumably reactive inflammation. This could  certainly explain hematuria. Similar appearance to the previous exams. Stomach/Bowel: Sigmoid diverticulosis and diverticulitis with catheter drainage of an abscess anterior and superior to the dome of the bladder. Small amount of fluid density material in contact with the catheter, about 2.5 cm in diameter. The overall region of phlegmonous inflammation continues to measure 7 cm in diameter. No new finding. Vascular/Lymphatic: Normal Reproductive: Normal Other: No free fluid or air. Musculoskeletal: Normal IMPRESSION: Continuing catheter drainage of a diverticular abscess located between sigmoid colon and the dome of the bladder within the anterior central pelvis. 2.5 cm in diameter fluid and air density collection in immediate contact with the pigtail portion of the catheter. Region of phlegmonous inflammation appears similar, measuring up to 7 cm in diameter. This phlegmonous inflammatory mass is in immediate contact with the dome of the bladder in there is bladder wall thickening consistent with inflammation. This could certainly be associated with hematuria. Electronically Signed   By: Paulina Fusi M.D.   On: 01/11/2018 08:51   Ct Abdomen Pelvis W Contrast  Result Date: 01/04/2018 CLINICAL DATA:  Intermittent lower abdominal pain for the past 2 weeks. 20 pound weight loss over the past year. History of diverticulitis. EXAM: CT ABDOMEN AND PELVIS WITH CONTRAST TECHNIQUE: Multidetector CT imaging of the abdomen and pelvis was performed using the standard protocol following bolus administration of intravenous  contrast. CONTRAST:  ISOVUE-300 IOPAMIDOL (ISOVUE-300) INJECTION 61% COMPARISON:  CT abdomen and pelvis dated February 19, 2017. FINDINGS: Lower chest: No acute abnormality. Hepatobiliary: No focal liver abnormality is seen. No gallstones, gallbladder wall thickening, or biliary dilatation. Pancreas: Unremarkable. No pancreatic ductal dilatation or surrounding inflammatory changes. Spleen: Normal in  size without focal abnormality. Adrenals/Urinary Tract: Adrenal glands are unremarkable. Kidneys are normal, without renal calculi, focal lesion, or hydronephrosis. Prominent anterosuperior bladder wall thickening, likely reactive. Stomach/Bowel: Segmental wall thickening and inflammatory changes surrounding the mid sigmoid colon with adjacent phlegmonous change and abscess. The area phlegmonous change measures approximately 5.7 x 5.6 x 4.6 cm (AP by transverse by CC). The rim enhancing fluid collection containing air within the phlegmonous change measures 3.0 x 3.7 x 3.3 cm (AP by transverse by CC). Phlegmonous change is inseparable from the right anterior superior bladder wall. The remaining colon is unremarkable. Normal appendix. The stomach and small bowel are unremarkable. No bowel obstruction. Vascular/Lymphatic: No significant vascular findings are present. No enlarged abdominal or pelvic lymph nodes. Reproductive: Prostate is unremarkable. Other: Trace free fluid in the pelvis. No abdominal wall hernia or abnormality. Musculoskeletal: No acute or significant osseous findings. IMPRESSION: 1. Acute sigmoid diverticulitis complicated by adjacent phlegmonous change and 3.7 cm abscess, which are inseparable from the right anterior superior bladder wall. There is reactive bladder wall thickening, but no evidence of air within the bladder to suggest fistula formation. These results will be called to the ordering clinician or representative by the Radiologist Assistant, and communication documented in the PACS or zVision Dashboard. Electronically Signed   By: Obie Dredge M.D.   On: 01/04/2018 11:50   Ct Image Guided Drainage By Percutaneous Catheter  Result Date: 01/05/2018 INDICATION: Pelvic diverticular abscess EXAM: CT GUIDED DRAINAGE OF PELVIC DIVERTICULAR ABSCESS MEDICATIONS: The patient is currently admitted to the hospital and receiving intravenous antibiotics. The antibiotics were administered within  an appropriate time frame prior to the initiation of the procedure. ANESTHESIA/SEDATION: 3.0 mg IV Versed 150 mcg IV Fentanyl Moderate Sedation Time:  13 MINUTES The patient was continuously monitored during the procedure by the interventional radiology nurse under my direct supervision. COMPLICATIONS: None immediate. TECHNIQUE: Informed written consent was obtained from the patient after a thorough discussion of the procedural risks, benefits and alternatives. All questions were addressed. Maximal Sterile Barrier Technique was utilized including caps, mask, sterile gowns, sterile gloves, sterile drape, hand hygiene and skin antiseptic. A timeout was performed prior to the initiation of the procedure. PROCEDURE: Previous imaging reviewed. Patient positioned supine. Noncontrast localization CT performed. The midline anterior pelvic abscess was localized with CT. Overlying skin marked for a right anterior oblique approach. Under sterile conditions and local anesthesia, an 18 gauge 10 cm access needle was advanced from an anterior oblique approach into the pelvic abscess. Needle position confirmed with CT. Guidewire inserted followed by tract dilatation to insert a 10 [place] drain. Drain catheter position confirmed with CT. Syringe aspiration yielded bloody exudative fluid. Sample sent for culture. Catheter secured with a Prolene suture and connected to external suction bulb. Sterile dressing applied. No immediate complication. Patient tolerated the procedure well. FINDINGS: CT imaging confirms percutaneous placement of CT drain for the pelvic diverticular abscess IMPRESSION: Successful CT drainage of the pelvic diverticular abscess. Electronically Signed   By: Judie Petit.  Shick M.D.   On: 01/05/2018 17:15   Ir Radiologist Eval & Mgmt  Result Date: 01/18/2018 Please refer to notes tab for details about interventional procedure. (Op Note)  Labs:  CBC: Recent Labs    01/04/18 1351 01/05/18 0403 01/06/18 1053  01/11/18 0551  WBC 11.5* 10.6* 9.8 8.7  HGB 12.8* 11.2* 12.9* 11.1*  HCT 40.0 35.7* 40.2 35.2*  PLT 356 325 425* 359    COAGS: Recent Labs    01/05/18 0403  INR 1.10    BMP: Recent Labs    02/20/17 0605 12/28/17 1157 01/04/18 1351 01/06/18 1053 01/11/18 0551  NA 137 139 137 138 137  K 4.3 4.0 3.9 4.0 3.5  CL 108 105 104 104 102  CO2 22 27 23 22 24   GLUCOSE 87 101* 101* 81 102*  BUN 11 13 7 11 8   CALCIUM 8.4* 9.7 9.2 9.0 9.1  CREATININE 0.95 0.98 0.95 1.10 0.95  GFRNONAA >60  --  >60 >60 >60  GFRAA >60  --  >60 >60 >60    LIVER FUNCTION TESTS: Recent Labs    02/18/17 1951 12/28/17 1157 01/04/18 1351  BILITOT 1.0 0.5 0.8  AST 21 15 17   ALT 21 16 18   ALKPHOS 93 100 95  PROT 7.8 8.5* 8.5*  ALBUMIN 4.1 4.4 4.0    TUMOR MARKERS: No results for input(s): AFPTM, CEA, CA199, CHROMGRNA in the last 8760 hours.  Assessment:  Diverticular abscess.  S/P placement of a drain 01/05/2018 by Dr. Miles Costain.  CT scan today = (reviewed by Dr. Miles Costain) Resolved anterior pelvic diverticular abscess at the drain catheter site however, there is still residual significant inflammation with phlegmonous reaction and soft tissue thickening along the right bladder dome.  Drain injection today= (reviewed by Dr. 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  Recommend continue to flush once per day x 7 more days then stop.  Bulb removed and gravity bag placed.  Return in 2 weeks with injection only. No need for CT.  Electronically Signed: Gwynneth Macleod PA-C 01/18/2018, 1:38 PM   Please refer to Dr. Chester Holstein attestation of this note for management and plan.

## 2018-01-24 ENCOUNTER — Encounter: Payer: Self-pay | Admitting: Nurse Practitioner

## 2018-01-24 NOTE — Progress Notes (Signed)
Name: Robert Chen   MRN: [id number]    DOB: 28-Aug-1986   Date:01/24/2018       Progress Note  Subjective  Chief Complaint  Chief Complaint  Patient presents with  . Establish Care    CPE, fasting    HPI Mr Robert Chen is coming in to establish care with me today, transferring from another provider who has left our clinic. He is currently being treated for abdominal and colonic abscess related to sigmoid diverticulitis. This was first diagnosed on CT abdomen/pelvis on 01/04/18 after an office visit for abdominal pain. He was admitted into the hosptial on 3/12 for this infection and given course of IV abx with percutaneous drain placed. He slowly advanced his diet to full liquids and able to be discharged to home on 3/16 on PO augmentin. He then went back to the ED on 3/19 for discoloration of urine-brown/pink and a repeat CT abdomen pelvic was obtained which showed a resolved abscess with residual inflammation and no new abscess or fluid collection. He was discharged home with instructions to continue f/u with surgery or if worsening symptoms. He tells me today that he continues to notice some red- brown discoloration of his urine but this has seemed to decrease. He denies fevers, nausea, vomiting, dysuria, urinary frequency, difficulty urinating. 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 He says he feels stable overall, does experience some abdominal pain intermittently. His JP drain in place now, he has a family member who is a Engineer, civil (consulting)nurse and has been helping him take care of the drain. He says he is eating a low fiber diet and seems to be tolerating this well.  Patient Active Problem List   Diagnosis Date Noted  . Dysuria 01/04/2018  . Lower abdominal pain 01/04/2018  . Colonic diverticular abscess 01/04/2018  . Right lower quadrant abdominal pain 12/28/2017  . Obesity 03/12/2017  . Uses Spanish as primary spoken language  02/20/2017  . Sinus tachycardia 02/20/2017  . Hypertension   . Sigmoid diverticulitis with small perforatrion 02/19/2017   Past Surgical History:  Procedure Laterality Date  . IR RADIOLOGIST EVAL & MGMT  01/18/2018  . NO PAST SURGERIES      Family History  Problem Relation Age of Onset  . Diabetes Father     Social History   Socioeconomic History  . Marital status: Married    Spouse name: Not on file  . Number of children: 1  . Years of education: Not on file  . Highest education level: Not on file  Occupational History  . Occupation: Training and development officerConstruction  Social Needs  . Financial resource strain: Not on file  . Food insecurity:    Worry: Not on file    Inability: Not on file  . Transportation needs:    Medical: Not on file    Non-medical: Not on file  Tobacco Use  . Smoking status: Never Smoker  . Smokeless tobacco: Never Used  Substance and Sexual Activity  . Alcohol use: No  . Drug use: No  . Sexual activity: Not on file  Lifestyle  . Physical activity:    Days per week: Not on file    Minutes per session: Not on file  . Stress: Not on file  Relationships  . Social connections:    Talks on phone: Not on file    Gets together: Not on file    Attends religious service: Not on file  Active member of club or organization: Not on file    Attends meetings of clubs or organizations: Not on file    Relationship status: Not on file  . Intimate partner violence:    Fear of current or ex partner: Not on file    Emotionally abused: Not on file    Physically abused: Not on file    Forced sexual activity: Not on file  Other Topics Concern  . Not on file  Social History Narrative   Fun/Hobby: Soccer     Current Outpatient Medications:  .  acetaminophen (TYLENOL) 500 MG tablet, Take 1,000 mg by mouth every 6 (six) hours as needed for headache., Disp: , Rfl:  .  amoxicillin-clavulanate (AUGMENTIN) 875-125 MG tablet, Take 1 tablet by mouth every 12 (twelve) hours.,  Disp: , Rfl:  .  Sodium Chloride Flush (SALINE FLUSH) 0.9 % SOLN, Inject 10 mLs into the vein daily. FLush abdominal drain with 5cc of NS daily.  May use 1 10cc syringe for 2 days, Disp: 5 Syringe, Rfl: 0  No Known Allergies   ROS See HPI  Objective  Vitals:   01/14/18 1352  BP: 124/80  Pulse: 82  Resp: 16  Temp: 98.2 F (36.8 C)  TempSrc: Oral  SpO2: 98%  Weight: 200 lb 12.8 oz (91.1 kg)  Height: 5\' 8"  (1.727 m)    Body mass index is 30.53 kg/m.  Physical Exam Vital signs reviewed. Constitutional: Patient appears well-developed and well-nourished. No distress.  HENT: Head: Normocephalic and atraumatic. Nose: Nose normal. Mouth/Throat: Oropharynx is clear and moist.  Eyes: Conjunctivae and EOM are normal. Pupils are equal, round, and reactive to light. No scleral icterus.  Neck: Normal range of motion. Neck supple. Cardiovascular: Normal rate, regular rhythm and normal heart sounds.  No murmur heard. No BLE edema. Distal pulses intact. Pulmonary/Chest: Effort normal and breath sounds normal. No respiratory distress. Abdominal: Soft. Bowel sounds are normal, no distension. There is mild tenderness to lower abdomen. No masses, no guarding. There is a clean dry dressing in place over percutaneous drain with little drainage noted. Musculoskeletal: Normal range of motion, no joint effusions. No gross deformities Neurological: he is alert and oriented to person, place, and time. Coordination, balance, strength, speech and gait are normal.  Skin: Skin is warm and dry. No rash noted. No erythema.  Psychiatric: Patient has a normal mood and affect. behavior is normal. Judgment and thought content normal.  Assessment & Plan RTC in 3 months for CPE  1. Sigmoid diverticulitis with small perforatrion Appears stable today. Continue F/U with [facility] Return precautions dicussed and printed on AVS  2. Colonic diverticular abscess Appears stable today. Continue F/U with [facility] Return  precautions dicussed and printed on AVS

## 2018-02-01 ENCOUNTER — Encounter: Payer: Self-pay | Admitting: Radiology

## 2018-02-01 ENCOUNTER — Other Ambulatory Visit: Payer: Self-pay | Admitting: Surgery

## 2018-02-01 ENCOUNTER — Ambulatory Visit
Admission: RE | Admit: 2018-02-01 | Discharge: 2018-02-01 | Disposition: A | Discharge status: Home / Self Care | Payer: Self-pay | Source: Ambulatory Visit | Attending: Surgery | Admitting: Surgery

## 2018-02-01 DIAGNOSIS — K5720 Diverticulitis of large intestine with perforation and abscess without bleeding: Secondary | ICD-10-CM

## 2018-02-01 HISTORY — PX: IR RADIOLOGIST EVAL & MGMT: IMG5224

## 2018-02-01 NOTE — Progress Notes (Signed)
Chief Complaint: Status post percutaneous drainage of sigmoid diverticular abscess on 01/05/2018.  Referring Physician(s): White,Christopher M  History of Present Illness: Robert Chen is a 32 y.o. male who returns for follow-up after previous catheter drainage of a sigmoid diverticular abscess.  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  The patient continues to have a small amount of brownish liquid return from the drainage catheter which is to gravity bag drainage.  He is no longer on antibiotics and denies fever.  He does state that yesterday he noticed some brownish foul-smelling fluid in his urine.  He denies any dysuria, pneumaturia or hematuria.  He states that he has a surgical follow-up appointment with Dr. Cliffton Asters in mid May.  Past Medical History:  Diagnosis Date  . Diverticulitis   . Hypertension     Past Surgical History:  Procedure Laterality Date  . IR RADIOLOGIST EVAL & MGMT  01/18/2018  . IR RADIOLOGIST EVAL & MGMT  02/01/2018  . NO PAST SURGERIES      Allergies: Patient has no known allergies.  Medications: Prior to Admission medications   Medication Sig Start Date End Date Taking? Authorizing Provider  acetaminophen (TYLENOL) 500 MG tablet Take 1,000 mg by mouth every 6 (six) hours as needed for headache.    [provider]  amoxicillin-clavulanate (AUGMENTIN) 875-125 MG tablet Take 1 tablet by mouth every 12 (twelve) hours.    [provider]  Sodium Chloride Flush (SALINE FLUSH) 0.9 % SOLN Inject 10 mLs into the vein daily. FLush abdominal drain with 5cc of NS daily.  May use 1 10cc syringe for 2 days 01/09/18   Barnetta Chapel, PA-C     Family History  Problem Relation Age of Onset  . Diabetes Father     Social History   Socioeconomic History  . Marital status: Married    Spouse name: Not on file  . Number of children: 1  . Years of education: Not on file  . Highest  education level: Not on file  Occupational History  . Occupation: Training and development officer  . Financial resource strain: Not on file  . Food insecurity:    Worry: Not on file    Inability: Not on file  . Transportation needs:    Medical: Not on file    Non-medical: Not on file  Tobacco Use  . Smoking status: Never Smoker  . Smokeless tobacco: Never Used  Substance and Sexual Activity  . Alcohol use: No  . Drug use: No  . Sexual activity: Not on file  Lifestyle  . Physical activity:    Days per week: Not on file    Minutes per session: Not on file  . Stress: Not on file  Relationships  . Social connections:    Talks on phone: Not on file    Gets together: Not on file    Attends religious service: Not on file    Active member of club or organization: Not on file    Attends meetings of clubs or organizations: Not on file    Relationship status: Not on file  Other Topics Concern  . Not on file  Social History Narrative   Fun/Hobby: Soccer    Review of Systems: A 12 point ROS discussed and pertinent positives are indicated in the HPI above.  All other systems are negative.  Review of Systems  Constitutional: Negative.   Gastrointestinal: Negative.   Genitourinary:  Brownish fluid in urine yesterday.  Musculoskeletal: Negative.   Neurological: Negative.     Vital Signs: BP (!) 141/87 (BP Location: Left Arm)   Pulse 77   Temp 97.9 F (36.6 C)   SpO2 100%   Physical Exam  Constitutional: He is oriented to person, place, and time. He appears well-developed and well-nourished. No distress.  Abdominal: Soft. He exhibits no distension. There is no tenderness. There is no rebound and no guarding.  Neurological: He is alert and oriented to person, place, and time.  Skin: He is not diaphoretic.  Vitals reviewed.   Imaging: Ct Abdomen Pelvis W Contrast  Result Date: 01/18/2018 CLINICAL DATA:  Pelvic diverticular abscess, status post percutaneous drain,  outpatient follow-up EXAM: CT ABDOMEN AND PELVIS WITH CONTRAST TECHNIQUE: Multidetector CT imaging of the abdomen and pelvis was performed using the standard protocol following bolus administration of intravenous contrast. CONTRAST:  125mL ISOVUE-300 IOPAMIDOL (ISOVUE-300) INJECTION 61% COMPARISON:  01/11/2018 FINDINGS: Lower chest: No acute abnormality. Hepatobiliary: No focal liver abnormality is seen. No gallstones, gallbladder wall thickening, or biliary dilatation. Pancreas: Unremarkable. No pancreatic ductal dilatation or surrounding inflammatory changes. Spleen: Normal in size without focal abnormality. Adrenals/Urinary Tract: Adrenal glands are unremarkable. Kidneys are normal, without renal calculi, focal lesion, or hydronephrosis. Interval minimal improvement in the soft tissue thickening and inflammation along the right bladder dome where the drain catheter is located. Stomach/Bowel: Negative for bowel obstruction, significant dilatation, ileus, or free air. Normal appendix. No new fluid collection or abscess. Stable right lower quadrant pelvic abscess drain location. Resolved small abscess however there is residual pericolonic inflammation and soft tissue thickening along the right bladder dome. Vascular/Lymphatic: Negative for aneurysm. No significant vascular occlusive process. No adenopathy. Reproductive: Prostate unremarkable. Seminal vesicles symmetric. No acute finding by CT. Other: No inguinal or abdominal wall hernia.  No ascites. Musculoskeletal: No acute or significant osseous finding. IMPRESSION: Resolved anterior pelvic diverticular abscess at the drain catheter site however, there is still residual significant inflammation with phlegmonous reaction and soft tissue thickening along the right bladder dome. No new abscess or abdominopelvic fluid collection. Diffuse colonic diverticulosis Electronically Signed   By: Judie PetitM.  Shick M.D.   On: 01/18/2018 13:06   Ct Abdomen Pelvis W Contrast  Result  Date: 01/11/2018 CLINICAL DATA:  Followup diverticular abscess drain. Re- presentation with hematuria. EXAM: CT ABDOMEN AND PELVIS WITH CONTRAST TECHNIQUE: Multidetector CT imaging of the abdomen and pelvis was performed using the standard protocol following bolus administration of intravenous contrast. CONTRAST:  100mL ISOVUE-300 IOPAMIDOL (ISOVUE-300) INJECTION 61% COMPARISON:  01/05/2018.  01/04/2018. FINDINGS: Lower chest: Normal Hepatobiliary: Normal Pancreas: Normal Spleen: Normal Adrenals/Urinary Tract: Adrenal glands are normal. Kidneys are normal. Anterior central pelvic diverticular abscess is in immediate contact with the dome of the bladder and there is bladder wall thickening, presumably reactive inflammation. This could certainly explain hematuria. Similar appearance to the previous exams. Stomach/Bowel: Sigmoid diverticulosis and diverticulitis with catheter drainage of an abscess anterior and superior to the dome of the bladder. Small amount of fluid density material in contact with the catheter, about 2.5 cm in diameter. The overall region of phlegmonous inflammation continues to measure 7 cm in diameter. No new finding. Vascular/Lymphatic: Normal Reproductive: Normal Other: No free fluid or air. Musculoskeletal: Normal IMPRESSION: Continuing catheter drainage of a diverticular abscess located between sigmoid colon and the dome of the bladder within the anterior central pelvis. 2.5 cm in diameter fluid and air density collection in immediate contact with the pigtail portion of the  catheter. Region of phlegmonous inflammation appears similar, measuring up to 7 cm in diameter. This phlegmonous inflammatory mass is in immediate contact with the dome of the bladder in there is bladder wall thickening consistent with inflammation. This could certainly be associated with hematuria. Electronically Signed   By: Paulina Fusi M.D.   On: 01/11/2018 08:51   Ct Abdomen Pelvis W Contrast  Result Date:  01/04/2018 CLINICAL DATA:  Intermittent lower abdominal pain for the past 2 weeks. 20 pound weight loss over the past year. History of diverticulitis. EXAM: CT ABDOMEN AND PELVIS WITH CONTRAST TECHNIQUE: Multidetector CT imaging of the abdomen and pelvis was performed using the standard protocol following bolus administration of intravenous contrast. CONTRAST:  ISOVUE-300 IOPAMIDOL (ISOVUE-300) INJECTION 61% COMPARISON:  CT abdomen and pelvis dated February 19, 2017. FINDINGS: Lower chest: No acute abnormality. Hepatobiliary: No focal liver abnormality is seen. No gallstones, gallbladder wall thickening, or biliary dilatation. Pancreas: Unremarkable. No pancreatic ductal dilatation or surrounding inflammatory changes. Spleen: Normal in size without focal abnormality. Adrenals/Urinary Tract: Adrenal glands are unremarkable. Kidneys are normal, without renal calculi, focal lesion, or hydronephrosis. Prominent anterosuperior bladder wall thickening, likely reactive. Stomach/Bowel: Segmental wall thickening and inflammatory changes surrounding the mid sigmoid colon with adjacent phlegmonous change and abscess. The area phlegmonous change measures approximately 5.7 x 5.6 x 4.6 cm (AP by transverse by CC). The rim enhancing fluid collection containing air within the phlegmonous change measures 3.0 x 3.7 x 3.3 cm (AP by transverse by CC). Phlegmonous change is inseparable from the right anterior superior bladder wall. The remaining colon is unremarkable. Normal appendix. The stomach and small bowel are unremarkable. No bowel obstruction. Vascular/Lymphatic: No significant vascular findings are present. No enlarged abdominal or pelvic lymph nodes. Reproductive: Prostate is unremarkable. Other: Trace free fluid in the pelvis. No abdominal wall hernia or abnormality. Musculoskeletal: No acute or significant osseous findings. IMPRESSION: 1. Acute sigmoid diverticulitis complicated by adjacent phlegmonous change and 3.7 cm  abscess, which are inseparable from the right anterior superior bladder wall. There is reactive bladder wall thickening, but no evidence of air within the bladder to suggest fistula formation. These results will be called to the ordering clinician or representative by the Radiologist Assistant, and communication documented in the PACS or zVision Dashboard. Electronically Signed   By: Obie Dredge M.D.   On: 01/04/2018 11:50   Dg Sinus/fist Tube Chk-non Gi  Result Date: 02/01/2018 INDICATION: Status post percutaneous catheter drainage of sigmoid diverticular abscess on 01/05/2018. Drain injection on 01/18/2018 demonstrated a fistula from the abscess cavity to the sigmoid colon. There is a minimal amount foul-smelling brownish colored liquid drainage from the drain currently. EXAM: INJECTION OF ABSCESS DRAINAGE CATHETER UNDER FLUOROSCOPY MEDICATIONS: The patient is no longer receiving antibiotics. ANESTHESIA/SEDATION: None CONTRAST:  10 mL Omnipaque 300 FLUOROSCOPY TIME:  42 seconds.  21.5  mGy. COMPLICATIONS: None immediate. PROCEDURE: Contrast was injected through the pre-existing drainage catheter. Multiple fluoroscopic spot images were obtained including fluoroscopic loops. The drainage catheter was then flushed and reconnected to a new gravity drainage bag. FINDINGS: With injection of the catheter, a collapsed abscess cavity initially fills with contrast followed by immediate filling of a thin fistula to the bladder lumen through the left superior dome of the bladder. Contrast then accumulates in the lumen of the bladder. With additional injection of contrast, no persistent fistula is demonstrated to the lumen of the sigmoid colon. IMPRESSION: Injection of the indwelling diverticular abscess drain now demonstrates a fistula to the bladder  lumen. No further persistent fistula to the lumen of the sigmoid colon is demonstrated. See additional clinic note dictated in Epic. Electronically Signed   By: Irish Lack M.D.   On: 02/01/2018 11:31   Dg Sinus/fist Tube Chk-non Gi  Result Date: 01/18/2018 INDICATION: Diverticular abscess, status POST PERCUTANEOUS DRAIN EXAM: FLUOROSCOPIC INJECTION OF THE DIVERTICULAR ABSCESS MEDICATIONS: The patient is an outpatient on oral antibiotics. ANESTHESIA/SEDATION: None. COMPLICATIONS: None immediate. PROCEDURE: Under fluoroscopy, the existing right lower quadrant abscess drain was injected with contrast. Fluoroscopic imaging performed. Abscess cavity is collapsed. There is a small fistula to the adjacent sigmoid colon. Contrast peristalsis and opacified throughout the colon. IMPRESSION: Positive exam for colonic fistula from the collapsed abscess cavity drain catheter site to the sigmoid colon. Electronically Signed   By: Judie Petit.  Shick M.D.   On: 01/18/2018 13:45   Ct Image Guided Drainage By Percutaneous Catheter  Result Date: 01/05/2018 INDICATION: Pelvic diverticular abscess EXAM: CT GUIDED DRAINAGE OF PELVIC DIVERTICULAR ABSCESS MEDICATIONS: The patient is currently admitted to the hospital and receiving intravenous antibiotics. The antibiotics were administered within an appropriate time frame prior to the initiation of the procedure. ANESTHESIA/SEDATION: 3.0 mg IV Versed 150 mcg IV Fentanyl Moderate Sedation Time:  13 MINUTES The patient was continuously monitored during the procedure by the interventional radiology nurse under my direct supervision. COMPLICATIONS: None immediate. TECHNIQUE: Informed written consent was obtained from the patient after a thorough discussion of the procedural risks, benefits and alternatives. All questions were addressed. Maximal Sterile Barrier Technique was utilized including caps, mask, sterile gowns, sterile gloves, sterile drape, hand hygiene and skin antiseptic. A timeout was performed prior to the initiation of the procedure. PROCEDURE: Previous imaging reviewed. Patient positioned supine. Noncontrast localization CT performed. The  midline anterior pelvic abscess was localized with CT. Overlying skin marked for a right anterior oblique approach. Under sterile conditions and local anesthesia, an 18 gauge 10 cm access needle was advanced from an anterior oblique approach into the pelvic abscess. Needle position confirmed with CT. Guidewire inserted followed by tract dilatation to insert a 10 [place] drain. Drain catheter position confirmed with CT. Syringe aspiration yielded bloody exudative fluid. Sample sent for culture. Catheter secured with a Prolene suture and connected to external suction bulb. Sterile dressing applied. No immediate complication. Patient tolerated the procedure well. FINDINGS: CT imaging confirms percutaneous placement of CT drain for the pelvic diverticular abscess IMPRESSION: Successful CT drainage of the pelvic diverticular abscess. Electronically Signed   By: Judie Petit.  Shick M.D.   On: 01/05/2018 17:15   Ir Radiologist Eval & Mgmt  Result Date: 02/01/2018 Please refer to notes tab for details about interventional procedure. (Op Note)  Ir Radiologist Eval & Mgmt  Result Date: 01/18/2018 Please refer to notes tab for details about interventional procedure. (Op Note)   Labs:  CBC: Recent Labs    01/04/18 1351 01/05/18 0403 01/06/18 1053 01/11/18 0551  WBC 11.5* 10.6* 9.8 8.7  HGB 12.8* 11.2* 12.9* 11.1*  HCT 40.0 35.7* 40.2 35.2*  PLT 356 325 425* 359    COAGS: Recent Labs    01/05/18 0403  INR 1.10    BMP: Recent Labs    02/20/17 0605 12/28/17 1157 01/04/18 1351 01/06/18 1053 01/11/18 0551  NA 137 139 137 138 137  K 4.3 4.0 3.9 4.0 3.5  CL 108 105 104 104 102  CO2 22 27 23 22 24   GLUCOSE 87 101* 101* 81 102*  BUN 11 13 7 11 8   CALCIUM  8.4* 9.7 9.2 9.0 9.1  CREATININE 0.95 0.98 0.95 1.10 0.95  GFRNONAA >60  --  >60 >60 >60  GFRAA >60  --  >60 >60 >60    LIVER FUNCTION TESTS: Recent Labs    02/18/17 1951 12/28/17 1157 01/04/18 1351  BILITOT 1.0 0.5 0.8  AST 21 15 17   ALT  21 16 18   ALKPHOS 93 100 95  PROT 7.8 8.5* 8.5*  ALBUMIN 4.1 4.4 4.0    Assessment and Plan:  The percutaneous drain was injected under fluoroscopy today demonstrating resolution of visible fistula to the sigmoid colon.  However, there are now is a visible fistula to the bladder lumen.  The catheter will be left in place to gravity drainage to try to close the fistula.  In the meantime, I recommended that Mr. Heber contact his primary care physician if he continues to notice any abnormal urinary discharge as he may require antibiotics should he develop a urinary infection.  I recommended another drain injection to reevaluate the fistula in 3 weeks.   Electronically SignedIrish Lack T 02/01/2018, 11:32 AM   I spent a total of 15 Minutes in face to face in clinical consultation, greater than 50% of which was counseling/coordinating care for percutaneous drainage of a sigmoid diverticular abscess with fistula to the bladder.

## 2018-02-03 ENCOUNTER — Encounter: Payer: Self-pay | Admitting: Family Medicine

## 2018-02-03 ENCOUNTER — Ambulatory Visit (INDEPENDENT_AMBULATORY_CARE_PROVIDER_SITE_OTHER): Payer: Self-pay | Admitting: Family Medicine

## 2018-02-03 ENCOUNTER — Other Ambulatory Visit (INDEPENDENT_AMBULATORY_CARE_PROVIDER_SITE_OTHER): Payer: Self-pay

## 2018-02-03 ENCOUNTER — Telehealth: Payer: Self-pay | Admitting: Family Medicine

## 2018-02-03 VITALS — BP 122/68 | HR 82 | Temp 98.0°F | Ht 68.0 in | Wt 191.0 lb

## 2018-02-03 DIAGNOSIS — K5720 Diverticulitis of large intestine with perforation and abscess without bleeding: Secondary | ICD-10-CM

## 2018-02-03 DIAGNOSIS — R1031 Right lower quadrant pain: Secondary | ICD-10-CM

## 2018-02-03 LAB — URINALYSIS, ROUTINE W REFLEX MICROSCOPIC
Bilirubin Urine: NEGATIVE
KETONES UR: NEGATIVE
NITRITE: NEGATIVE
Specific Gravity, Urine: 1.02 (ref 1.000–1.030)
Total Protein, Urine: 100 — AB
URINE GLUCOSE: NEGATIVE
UROBILINOGEN UA: 0.2 (ref 0.0–1.0)
pH: 6.5 (ref 5.0–8.0)

## 2018-02-03 LAB — BASIC METABOLIC PANEL
BUN: 8 mg/dL (ref 6–23)
CHLORIDE: 105 meq/L (ref 96–112)
CO2: 26 meq/L (ref 19–32)
CREATININE: 0.91 mg/dL (ref 0.40–1.50)
Calcium: 9.3 mg/dL (ref 8.4–10.5)
GFR: 101.44 mL/min (ref >60.00)
Glucose, Bld: 103 mg/dL — ABNORMAL HIGH (ref 70–99)
Potassium: 4.1 mEq/L (ref 3.5–5.1)
Sodium: 137 mEq/L (ref 135–145)

## 2018-02-03 MED ORDER — AMOXICILLIN-POT CLAVULANATE 875-125 MG PO TABS: 1.0000 | ORAL_TABLET | Freq: Two times a day (BID) | ORAL | 0 refills | Status: AC

## 2018-02-03 NOTE — Progress Notes (Signed)
Robert Chen - 32 y.o. male MRN [id number]  Date of birth: [birth year]  SUBJECTIVE:  Including CC & ROS.  Chief Complaint  Patient presents with  . Abdominal Pain    Robert Chen is a 32 y.o. male that is presenting with abdominal pain. Patient had a fistula drained located in is sigmoid colon on 01/18/18.  Denies fever. Denies changes in bowel. Admits to burning and discoloration in his urine. Reports that his urine will intermittently change colors.  Last week his urine was more clear in nature. This morning it was clear but has changed colors throughout the course the day.  Patient was seen by IR on 4/84for the placement of a percutaneous drain. They observed that there is a fistula to the bladder lumen. It was recommended at that time to be seen and case antibiotics were required.  ROS:  Denies fever, shortness of breath, chest pain, gait abnormality, weakness, agitation, or color change. Positive for changes in the color of his urine.   HISTORY: Past Medical, Surgical, Social, and Family History Reviewed & Updated per EMR.   Pertinent Historical Findings include:  Past Medical History:  Diagnosis Date  . Diverticulitis   . Hypertension     Past Surgical History:  Procedure Laterality Date  . IR RADIOLOGIST EVAL & MGMT  01/18/2018  . IR RADIOLOGIST EVAL & MGMT  02/01/2018  . NO PAST SURGERIES      No Known Allergies  Family History  Problem Relation Age of Onset  . Diabetes Father      Social History   Socioeconomic History  . Marital status: Married    Spouse name: Not on file  . Number of children: 1  . Years of education: Not on file  . Highest education level: Not on file  Occupational History  . Occupation: Training and development officer  . Financial resource strain: Not on file  . Food insecurity:    Worry: Not on file    Inability: Not on file  . Transportation needs:    Medical: Not on file    Non-medical: Not on file  Tobacco Use  . Smoking  status: Never Smoker  . Smokeless tobacco: Never Used  Substance and Sexual Activity  . Alcohol use: No  . Drug use: No  . Sexual activity: Not on file  Lifestyle  . Physical activity:    Days per week: Not on file    Minutes per session: Not on file  . Stress: Not on file  Relationships  . Social connections:    Talks on phone: Not on file    Gets together: Not on file    Attends religious service: Not on file    Active member of club or organization: Not on file    Attends meetings of clubs or organizations: Not on file    Relationship status: Not on file  . Intimate partner violence:    Fear of current or ex partner: Not on file    Emotionally abused: Not on file    Physically abused: Not on file    Forced sexual activity: Not on file  Other Topics Concern  . Not on file  Social History Narrative   Fun/Hobby: Soccer     PHYSICAL EXAM:  VS: BP 122/68 (BP Location: Left Arm, Patient Position: Sitting, Cuff Size: Normal)   Pulse 82   Temp 98 F (36.7 C) (Oral)   Ht 5\' 8"  (1.727 m)   Wt 191 lb (86.6 kg)  SpO2 98%   BMI 29.04 kg/m  Physical Exam Gen: NAD, alert, cooperative with exam, well-appearing ENT: normal lips, normal nasal mucosa,  Eye: normal EOM, normal conjunctiva and lids CV:  no edema, +2 pedal pulses   Resp: no accessory muscle use, non-labored,  GI: generalized tenderness to palpation, right lower quadrant is covered with tape and gauze, no redness or distention  Skin: no rashes, no areas of induration  Neuro: normal tone, normal sensation to touch Psych:  normal insight, alert and oriented MSK: normal gait, normal strength       ASSESSMENT & PLAN:   Colonic diverticular abscess 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 Denies any fevers.  - UA and urine culture  - will call ABX in if needed.

## 2018-02-03 NOTE — Telephone Encounter (Signed)
Informed patient's wife of patient's results. Will send in Augmentin   Myra RudeSchmitz, Aizen Duval E, MD [facility] 02/03/2018, 5:36 PM

## 2018-02-03 NOTE — Patient Instructions (Signed)
We will call you with the results from today  I will send in an antibiotic if one is needed.

## 2018-02-03 NOTE — Assessment & Plan Note (Signed)
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 Denies any fevers.  - UA and urine culture  - will call ABX in if needed.

## 2018-02-05 LAB — URINE CULTURE
MICRO NUMBER:: 90447825
SPECIMEN QUALITY:: ADEQUATE

## 2018-02-22 ENCOUNTER — Encounter: Payer: Self-pay | Admitting: Radiology

## 2018-02-22 ENCOUNTER — Ambulatory Visit
Admission: RE | Admit: 2018-02-22 | Discharge: 2018-02-22 | Disposition: A | Discharge status: Home / Self Care | Payer: Self-pay | Source: Ambulatory Visit | Attending: Surgery | Admitting: Surgery

## 2018-02-22 ENCOUNTER — Other Ambulatory Visit: Payer: Self-pay | Admitting: Surgery

## 2018-02-22 DIAGNOSIS — K5720 Diverticulitis of large intestine with perforation and abscess without bleeding: Secondary | ICD-10-CM

## 2018-02-22 HISTORY — PX: IR RADIOLOGIST EVAL & MGMT: IMG5224

## 2018-02-22 NOTE — Progress Notes (Signed)
Chief Complaint: Diverticular abscess  Referring Physician(s): White,Christopher M  History of Present Illness: Robert Chen is a 32 y.o. male presenting as a scheduled follow up with an indwelling abscess drain.   He has had a prior episode of diverticulitis in March, with drainage performed 01/05/2018.  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    He has completed his abx.  He denies any fevers, rigors, chills.  He has not been flushing the drain.  He has no output at this point.  He denies any air or feces in his urine.  He denies any symptoms of UTI. Denies any abdominal pain.   He tells me he has an appointment on 03/14/2018 for what sounds like colonoscopy.  He does not know of his next surgery appointment.   Past Medical History:  Diagnosis Date  . Diverticulitis   . Hypertension     Past Surgical History:  Procedure Laterality Date  . IR RADIOLOGIST EVAL & MGMT  01/18/2018  . IR RADIOLOGIST EVAL & MGMT  02/01/2018  . IR RADIOLOGIST EVAL & MGMT  02/22/2018  . NO PAST SURGERIES      Allergies: Patient has no known allergies.  Medications: Prior to Admission medications   Medication Sig Start Date End Date Taking? Authorizing Provider  acetaminophen (TYLENOL) 500 MG tablet Take 1,000 mg by mouth every 6 (six) hours as needed for headache.    [provider]  Sodium Chloride Flush (SALINE FLUSH) 0.9 % SOLN Inject 10 mLs into the vein daily. FLush abdominal drain with 5cc of NS daily.  May use 1 10cc syringe for 2 days 01/09/18   Barnetta Chapel, PA-C     Family History  Problem Relation Age of Onset  . Diabetes Father     Social History   Socioeconomic History  . Marital status: Married    Spouse name: Not on file  . Number of children: 1  . Years of education: Not on file  . Highest education level: Not on file  Occupational History  . Occupation: Education officer, environmental  . Financial resource strain: Not on file  . Food insecurity:    Worry: Not on file    Inability: Not on file  . Transportation needs:    Medical: Not on file    Non-medical: Not on file  Tobacco Use  . Smoking status: Never Smoker  . Smokeless tobacco: Never Used  Substance and Sexual Activity  . Alcohol use: No  . Drug use: No  . Sexual activity: Not on file  Lifestyle  . Physical activity:    Days per week: Not on file    Minutes per session: Not on file  . Stress: Not on file  Relationships  . Social connections:    Talks on phone: Not on file    Gets together: Not on file    Attends religious service: Not on file    Active member of club or organization: Not on file    Attends meetings of clubs or organizations: Not on file    Relationship status: Not on file  Other Topics Concern  . Not on file  Social History Narrative   Fun/Hobby: Soccer       Review of Systems: A 12 point ROS discussed and pertinent positives are indicated in the HPI above.  All other systems are negative.  Review of Systems  Vital Signs:  BP (!) 153/95   Pulse 90   Temp 97.6 F (36.4 C)   SpO2 100%   Physical Exam  Mallampati Score:     Imaging: Dg Sinus/fist Tube Chk-non Gi  Result Date: 02/22/2018 INDICATION: 32 year old male with a history of diverticulitis and abscess with drainage performed 01/05/2018. 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 EXAM: IMAGE GUIDED DRAIN INJECTION MEDICATIONS: The patient is currently admitted to the hospital and receiving intravenous antibiotics. The antibiotics were administered within an appropriate time frame prior to the initiation of the procedure. ANESTHESIA/SEDATION: NONE COMPLICATIONS: NONE PROCEDURE: Informed written consent was obtained  from the patient after a thorough discussion of the procedural risks, benefits and alternatives. All questions were addressed. Maximal Sterile Barrier Technique was utilized including caps, mask, sterile gowns, sterile gloves, sterile drape, hand hygiene and skin antiseptic. A timeout was performed prior to the initiation of the procedure. Patient positioned supine position on the fluoroscopy table. Scout images of the abdomen and pelvis were performed. Contrast was gently injected through the indwelling abscess drain. No significant abscess cavity, with a small persisting tract to the urinary bladder. Catheter was reattached to gravity drainage. 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 Signed, Yvone Neu. Loreta Ave, DO [facility] Electronically Signed   By: Gilmer Mor D.O.   On: 02/22/2018 11:14   Dg Sinus/fist Tube Chk-non Gi  Result Date: 02/01/2018 INDICATION: Status post percutaneous catheter drainage of sigmoid diverticular abscess on 01/05/2018. Drain injection on 01/18/2018 demonstrated a fistula from the abscess cavity to the sigmoid colon. There is a minimal amount foul-smelling brownish colored liquid drainage from the drain currently. EXAM: INJECTION OF ABSCESS DRAINAGE CATHETER UNDER FLUOROSCOPY MEDICATIONS: The patient is no longer receiving antibiotics. ANESTHESIA/SEDATION: None CONTRAST:  10 mL Omnipaque 300 FLUOROSCOPY TIME:  42 seconds.  21.5  mGy. COMPLICATIONS: None immediate. PROCEDURE: Contrast was injected through the pre-existing drainage catheter. Multiple fluoroscopic spot images were obtained including fluoroscopic loops. The drainage catheter was then flushed and reconnected to a new gravity drainage bag. FINDINGS: With injection of the catheter, a collapsed abscess cavity initially fills with contrast followed by immediate  filling of a thin fistula to the bladder lumen through the left superior dome of the bladder. Contrast then accumulates in the lumen of the bladder. With additional injection of contrast, no persistent fistula is demonstrated to the lumen of the sigmoid colon. 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 No further persistent fistula to the lumen of the sigmoid colon is demonstrated. See additional clinic note dictated in Epic. Electronically Signed   By: Irish Lack M.D.   On: 02/01/2018 11:31   Ir Radiologist Eval & Mgmt  Result Date: 02/22/2018 Please refer to notes tab for details about interventional procedure. (Op Note)  Ir Radiologist Eval & Mgmt  Result Date: 02/01/2018 Please refer to notes tab for details about interventional procedure. (Op Note)   Labs:  CBC: Recent Labs    01/04/18 1351 01/05/18 0403 01/06/18 1053 01/11/18 0551  WBC 11.5* 10.6* 9.8 8.7  HGB 12.8* 11.2* 12.9* 11.1*  HCT 40.0 35.7* 40.2 35.2*  PLT 356 325 425* 359    COAGS: Recent Labs    01/05/18  0403  INR 1.10    BMP: Recent Labs    01/04/18 1351 01/06/18 1053 01/11/18 0551 02/03/18 0906  NA 137 138 137 137  K 3.9 4.0 3.5 4.1  CL 104 104 102 105  CO2 GLUCOSE 101* 81 102* 103*  BUN CALCIUM 9.2 9.0 9.1 9.3  CREATININE 0.95 1.10 0.95 0.91  GFRNONAA >60 >60 >60  --   GFRAA >60 >60 >60  --     LIVER FUNCTION TESTS: Recent Labs    12/28/17 1157 01/04/18 1351  BILITOT 0.5 0.8  AST 15 17  ALT 16 18  ALKPHOS 100 95  PROT 8.5* 8.5*  ALBUMIN 4.4 4.0    TUMOR MARKERS: No results for input(s): AFPTM, CEA, CA199, CHROMGRNA in the last 8760 hours.  Assessment and Plan:  32 year old male presents with abscess drain in the pelvis, with prior diverticulitis.    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  The caliber of the connection seems smaller  than the prior injection.    I have advised him to continue his current treatment.  I agree that he should not be flushing the drain.    I have advised him to observe his future appointment with GI for colonoscopy, as well as observe his surgical follow up appointments.    We will schedule a follow up drain injection without CT scan in ~4 weeks.  .     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

## 2018-03-22 ENCOUNTER — Encounter: Payer: Self-pay | Admitting: Radiology

## 2018-03-22 ENCOUNTER — Ambulatory Visit
Admission: RE | Admit: 2018-03-22 | Discharge: 2018-03-22 | Disposition: A | Discharge status: Home / Self Care | Payer: Self-pay | Source: Ambulatory Visit | Attending: Surgery | Admitting: Surgery

## 2018-03-22 ENCOUNTER — Other Ambulatory Visit: Payer: Self-pay | Admitting: Surgery

## 2018-03-22 DIAGNOSIS — K5720 Diverticulitis of large intestine with perforation and abscess without bleeding: Secondary | ICD-10-CM

## 2018-03-22 HISTORY — PX: IR RADIOLOGIST EVAL & MGMT: IMG5224

## 2018-03-22 NOTE — Progress Notes (Signed)
Patient ID: Robert Chen, male   DOB: Feb 25, 1986, 32 y.o.   MRN: [id number]       Chief Complaint: Diverticular abscess, status post pertains drain   Referring Physician(s): White,Christopher M  History of Present Illness: Robert Chen is a 32 y.o. male with a chronic anterior pelvic diverticular abscess drained.  Drainage catheter placement was performed 01/05/2018.  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  In April, a small fistula to the bladder dome was demonstrated by fluoroscopic injection.  He has completed antibiotics.  He remains afebrile.  Stable weight and appetite.  He is not flushing the drain.  Catheter remains to gravity drainage.  He denies any air or stool in his urine.  Normal bowel habits.  No signs of urinary tract infection.  No significant abdominal pelvic pain.  He has close follow-up with GI and surgery.  Past Medical History:  Diagnosis Date  . Diverticulitis   . Hypertension     Past Surgical History:  Procedure Laterality Date  . IR RADIOLOGIST EVAL & MGMT  01/18/2018  . IR RADIOLOGIST EVAL & MGMT  02/01/2018  . IR RADIOLOGIST EVAL & MGMT  02/22/2018  . IR RADIOLOGIST EVAL & MGMT  03/22/2018  . NO PAST SURGERIES      Allergies: Patient has no known allergies.  Medications: Prior to Admission medications   Medication Sig Start Date End Date Taking? Authorizing Provider  acetaminophen (TYLENOL) 500 MG tablet Take 1,000 mg by mouth every 6 (six) hours as needed for headache.    [provider]  Sodium Chloride Flush (SALINE FLUSH) 0.9 % SOLN Inject 10 mLs into the vein daily. FLush abdominal drain with 5cc of NS daily.  May use 1 10cc syringe for 2 days 01/09/18   Barnetta Chapel, PA-C     Family History  Problem Relation Age of Onset  . Diabetes Father     Social History   Socioeconomic History  . Marital status: Married    Spouse name: Not on file  . Number of children: 1    . Years of education: Not on file  . Highest education level: Not on file  Occupational History  . Occupation: Training and development officer  . Financial resource strain: Not on file  . Food insecurity:    Worry: Not on file    Inability: Not on file  . Transportation needs:    Medical: Not on file    Non-medical: Not on file  Tobacco Use  . Smoking status: Never Smoker  . Smokeless tobacco: Never Used  Substance and Sexual Activity  . Alcohol use: No  . Drug use: No  . Sexual activity: Not on file  Lifestyle  . Physical activity:    Days per week: Not on file    Minutes per session: Not on file  . Stress: Not on file  Relationships  . Social connections:    Talks on phone: Not on file    Gets together: Not on file    Attends religious service: Not on file    Active member of club or organization: Not on file    Attends meetings of clubs or organizations: Not on file    Relationship status: Not on file  Other Topics Concern  . Not on file  Social History Narrative   Fun/Hobby: Soccer     Review of Systems: A 12 point ROS discussed and pertinent positives are indicated in the HPI above.  All  other systems are negative.  Review of Systems  Vital Signs: BP (!) 142/75 (BP Location: Left Arm)   Pulse 68   Temp (!) 97.5 F (36.4 C)   SpO2 100%   Physical Exam  Constitutional: He appears well-developed and well-nourished. No distress.  Abdominal: Soft. Bowel sounds are normal. He exhibits no distension.  Anterior pelvic abscess drain catheter site is clean, dry and intact.  No signs of infection.  Skin: He is not diaphoretic.    Imaging: Dg Sinus/fist Tube Chk-non Gi  Result Date: 03/22/2018 CLINICAL DATA:  Diverticulitis complicated by abscess, status post percutaneous drain 01/05/2018. Abscess drain injection previously demonstrated small fistula to the urinary bladder. EXAM: ABSCESS INJECTION CONTRAST:  7 cc Omnipaque 300 FLUOROSCOPY TIME:  Fluoroscopy Time:  42  seconds Radiation Exposure Index (if provided by the fluoroscopic device): 17 mGy Number of Acquired Spot Images: 4 COMPARISON:  02/22/2018 FINDINGS: The existing anterior pelvic abscess drain was injected with contrast. Fluoroscopic imaging performed. Stable drain catheter position. Abscess cavity remains collapsed. Similar small patent fistula from the collapsed abscess cavity to the bladder. No significant interval change. IMPRESSION: Similar small patent fistula from the collapsed abscess cavity site to the dome of the bladder. Electronically Signed   By: Judie Petit.  Nycere Presley M.D.   On: 03/22/2018 13:11   Dg Sinus/fist Tube Chk-non Gi  Result Date: 02/22/2018 INDICATION: 32 year old male with a history of diverticulitis and abscess with drainage performed 01/05/2018. 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 EXAM: IMAGE GUIDED DRAIN INJECTION MEDICATIONS: The patient is currently admitted to the hospital and receiving intravenous antibiotics. The antibiotics were administered within an appropriate time frame prior to the initiation of the procedure. ANESTHESIA/SEDATION: NONE COMPLICATIONS: NONE PROCEDURE: Informed written consent was obtained from the patient after a thorough discussion of the procedural risks, benefits and alternatives. All questions were addressed. Maximal Sterile Barrier Technique was utilized including caps, mask, sterile gowns, sterile gloves, sterile drape, hand hygiene and skin antiseptic. A timeout was performed prior to the initiation of the procedure. Patient positioned supine position on the fluoroscopy table. Scout images of the abdomen and pelvis were performed. Contrast was gently injected through the indwelling abscess drain. No significant abscess cavity, with a small persisting tract to the  urinary bladder. Catheter was reattached to gravity drainage. 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 Signed, Yvone Neu. Loreta Ave, DO [facility] Electronically Signed   By: Gilmer Mor D.O.   On: 02/22/2018 11:14   Ir Radiologist Eval & Mgmt  Result Date: 03/22/2018 Please refer to notes tab for details about interventional procedure. (Op Note)  Ir Radiologist Eval & Mgmt  Result Date: 02/22/2018 Please refer to notes tab for details about interventional procedure. (Op Note)   Labs:  CBC: Recent Labs    01/04/18 1351 01/05/18 0403 01/06/18 1053 01/11/18 0551  WBC 11.5* 10.6* 9.8 8.7  HGB 12.8* 11.2* 12.9* 11.1*  HCT 40.0 35.7* 40.2 35.2*  PLT 356 325 425* 359    COAGS: Recent Labs    01/05/18 0403  INR 1.10    BMP: Recent Labs    01/04/18 1351 01/06/18 1053 01/11/18 0551 02/03/18 0906  NA 137 138 137 137  K 3.9 4.0 3.5 4.1  CL  104 104 102 105  CO2 GLUCOSE 101* 81 102* 103*  BUN CALCIUM 9.2 9.0 9.1 9.3  CREATININE 0.95 1.10 0.95 0.91  GFRNONAA >60 >60 >60  --   GFRAA >60 >60 >60  --     LIVER FUNCTION TESTS: Recent Labs    12/28/17 1157 01/04/18 1351  BILITOT 0.5 0.8  AST 15 17  ALT 16 18  ALKPHOS 100 95  PROT 8.5* 8.5*  ALBUMIN 4.4 4.0     Assessment and Plan:  Chronic anterior pelvic diverticular abscess drainage.  Fluoroscopic imaging injection today confirms a residual similar small fistula to the urinary bladder dome.  No significant interval change.  Plan: Continue external gravity drainage without flushing.  Maintain outpatient surgical follow-up  Consider repeat drain injection in 4 weeks.    Electronically Signed: Berdine Dance 03/22/2018, 1:17 PM   I spent a total of    15 Minutes in face to face in clinical consultation, greater than 50% of  which was counseling/coordinating care for this patient with abscess drain and fistula to the bladder.

## 2018-04-01 ENCOUNTER — Ambulatory Visit: Payer: Self-pay | Admitting: Surgery

## 2018-04-04 ENCOUNTER — Other Ambulatory Visit: Payer: Self-pay | Admitting: Urology

## 2018-04-07 NOTE — Patient Instructions (Signed)
Robert Chen  04/07/2018   Your procedure is scheduled on: 04-20-18  Report to [facility] Main  Entrance               Report to admitting at      1130 AM    Call this number if you have problems the morning of surgery [phone]   Remember: Do not eat food  :After Midnight.              Follow bowel prep per MD follow a clear liquid diet the day of you bowel prep to prevent dehydration   DRINK 2 PRESURGERY ENSURE DRINKS THE NIGHT BEFORE SURGERY AT  1000 PM AND 1 PRESURGERY DRINK THE DAY OF THE PROCEDURE 3 HOURS PRIOR TO SCHEDULED SURGERY. NO SOLIDS AFTER MIDNIGHT THE DAY PRIOR TO THE SURGERY. NOTHING BY MOUTH EXCEPT CLEAR LIQUIDS UNTIL THREE HOURS PRIOR TO SCHEDULED SURGERY. PLEASE FINISH PRESURGERY ENSURE DRINK PER SURGEON ORDER 3 HOURS PRIOR TO SCHEDULED SURGERY TIME WHICH NEEDS TO BE COMPLETED AT ____1030am_____.     CLEAR LIQUID DIET   Foods Allowed                                                                     Foods Excluded  Coffee and tea, regular and decaf                             liquids that you cannot  Plain Jell-O in any flavor                                             see through such as: Fruit ices (not with fruit pulp)                                     milk, soups, orange juice  Iced Popsicles                                    All solid food Carbonated beverages, regular and diet                                    Cranberry, grape and apple juices Sports drinks like Gatorade Lightly seasoned clear broth or consume(fat free) Sugar, honey syrup  Sample Menu Breakfast                                Lunch                                     Supper Cranberry juice  Beef broth                            Chicken broth Jell-O                                     Grape juice                           Apple juice Coffee or tea                        Jell-O                                      Popsicle                                                 Coffee or tea                        Coffee or tea  _____________________________________________________________________    Take these medicines the morning of surgery with A SIP OF WATER: none                                You may not have any metal on your body including hair pins and              piercings  Do not wear jewelry, make-up, lotions, powders or perfumes, deodorant             Do not wear nail polish.  Do not shave  48 hours prior to surgery.              Men may shave face and neck.   Do not bring valuables to the hospital. [facility] IS NOT             RESPONSIBLE   FOR VALUABLES.  Contacts, dentures or bridgework may not be worn into surgery.  Leave suitcase in the car. After surgery it may be brought to your room.     Patients discharged the day of surgery will not be allowed to drive home.  Name and phone number of your driver:  Special Instructions: N/A              Please read over the following fact sheets you were given: _____________________________________________________________________             [facility] - Preparing for Surgery Before surgery, you can play an important role.  Because skin is not sterile, your skin needs to be as free of germs as possible.  You can reduce the number of germs on your skin by washing with CHG (chlorahexidine gluconate) soap before surgery.  CHG is an antiseptic cleaner which kills germs and bonds with the skin to continue killing germs even after washing. Please DO NOT use if you have an allergy to CHG or antibacterial soaps.  If your skin becomes reddened/irritated stop using the CHG and inform your nurse when you arrive at Short Stay.  Do not shave (including legs and underarms) for at least 48 hours prior to the first CHG shower.  You may shave your face/neck. Please follow these instructions carefully:  1.  Shower with CHG Soap the night before surgery and the  morning of  Surgery.  2.  If you choose to wash your hair, wash your hair first as usual with your  normal  shampoo.  3.  After you shampoo, rinse your hair and body thoroughly to remove the  shampoo.                           4.  Use CHG as you would any other liquid soap.  You can apply chg directly  to the skin and wash                       Gently with a scrungie or clean washcloth.  5.  Apply the CHG Soap to your body ONLY FROM THE NECK DOWN.   Do not use on face/ open                           Wound or open sores. Avoid contact with eyes, ears mouth and genitals (private parts).                       Wash face,  Genitals (private parts) with your normal soap.             6.  Wash thoroughly, paying special attention to the area where your surgery  will be performed.  7.  Thoroughly rinse your body with warm water from the neck down.  8.  DO NOT shower/wash with your normal soap after using and rinsing off  the CHG Soap.                9.  Pat yourself dry with a clean towel.            10.  Wear clean pajamas.            11.  Place clean sheets on your bed the night of your first shower and do not  sleep with pets. Day of Surgery : Do not apply any lotions/deodorants the morning of surgery.  Please wear clean clothes to the hospital/surgery center.  FAILURE TO FOLLOW THESE INSTRUCTIONS MAY RESULT IN THE CANCELLATION OF YOUR SURGERY PATIENT SIGNATURE_________________________________  NURSE SIGNATURE__________________________________  ________________________________________________________________________  WHAT IS A BLOOD TRANSFUSION? Blood Transfusion Information  A transfusion is the replacement of blood or some of its parts. Blood is made up of multiple cells which provide different functions.  Red blood cells carry oxygen and are used for blood loss replacement.  White blood cells fight against infection.  Platelets control bleeding.  Plasma helps clot blood.  Other blood products are  available for specialized needs, such as hemophilia or other clotting disorders. BEFORE THE TRANSFUSION  Who gives blood for transfusions?   Healthy volunteers who are fully evaluated to make sure their blood is safe. This is blood bank blood. Transfusion therapy is the safest it has ever been in the practice of medicine. Before blood is taken from a donor, a complete history is taken to make sure that person has no history of diseases nor engages in risky social behavior (examples are intravenous drug use or sexual activity with multiple partners). The donor's travel  history is screened to minimize risk of transmitting infections, such as malaria. The donated blood is tested for signs of infectious diseases, such as HIV and hepatitis. The blood is then tested to be sure it is compatible with you in order to minimize the chance of a transfusion reaction. If you or a relative donates blood, this is often done in anticipation of surgery and is not appropriate for emergency situations. It takes many days to process the donated blood. RISKS AND COMPLICATIONS Although transfusion therapy is very safe and saves many lives, the main dangers of transfusion include:   Getting an infectious disease.  Developing a transfusion reaction. This is an allergic reaction to something in the blood you were given. Every precaution is taken to prevent this. The decision to have a blood transfusion has been considered carefully by your caregiver before blood is given. Blood is not given unless the benefits outweigh the risks. AFTER THE TRANSFUSION  Right after receiving a blood transfusion, you will usually feel much better and more energetic. This is especially true if your red blood cells have gotten low (anemic). The transfusion raises the level of the red blood cells which carry oxygen, and this usually causes an energy increase.  The nurse administering the transfusion will monitor you carefully for  complications. HOME CARE INSTRUCTIONS  No special instructions are needed after a transfusion. You may find your energy is better. Speak with your caregiver about any limitations on activity for underlying diseases you may have. SEEK MEDICAL CARE IF:   Your condition is not improving after your transfusion.  You develop redness or irritation at the intravenous (IV) site. SEEK IMMEDIATE MEDICAL CARE IF:  Any of the following symptoms occur over the next 12 hours:  Shaking chills.  You have a temperature by mouth above 102 F (38.9 C), not controlled by medicine.  Chest, back, or muscle pain.  People around you feel you are not acting correctly or are confused.  Shortness of breath or difficulty breathing.  Dizziness and fainting.  You get a rash or develop hives.  You have a decrease in urine output.  Your urine turns a dark color or changes to pink, red, or brown. Any of the following symptoms occur over the next 10 days:  You have a temperature by mouth above 102 F (38.9 C), not controlled by medicine.  Shortness of breath.  Weakness after normal activity.  The white part of the eye turns yellow (jaundice).  You have a decrease in the amount of urine or are urinating less often.  Your urine turns a dark color or changes to pink, red, or brown. Document Released: 10/09/2000 Document Revised: 01/04/2012 Document Reviewed: 05/28/2008 [employer] Patient Information 2014 [place], [place].  _______________________________________________________________________  Incentive Spirometer  An incentive spirometer is a tool that can help keep your lungs clear and active. This tool measures how well you are filling your lungs with each breath. Taking long deep breaths may help reverse or decrease the chance of developing breathing (pulmonary) problems (especially infection) following:  A long period of time when you are unable to move or be active. BEFORE THE PROCEDURE   If  the spirometer includes an indicator to show your best effort, your nurse or respiratory therapist will set it to a desired goal.  If possible, sit up straight or lean slightly forward. Try not to slouch.  Hold the incentive spirometer in an upright position. INSTRUCTIONS FOR USE  1. Sit on the edge of your  bed if possible, or sit up as far as you can in bed or on a chair. 2. Hold the incentive spirometer in an upright position. 3. Breathe out normally. 4. Place the mouthpiece in your mouth and seal your lips tightly around it. 5. Breathe in slowly and as deeply as possible, raising the piston or the ball toward the top of the column. 6. Hold your breath for 3-5 seconds or for as long as possible. Allow the piston or ball to fall to the bottom of the column. 7. Remove the mouthpiece from your mouth and breathe out normally. 8. Rest for a few seconds and repeat Steps 1 through 7 at least 10 times every 1-2 hours when you are awake. Take your time and take a few normal breaths between deep breaths. 9. The spirometer may include an indicator to show your best effort. Use the indicator as a goal to work toward during each repetition. 10. After each set of 10 deep breaths, practice coughing to be sure your lungs are clear. If you have an incision (the cut made at the time of surgery), support your incision when coughing by placing a pillow or rolled up towels firmly against it. Once you are able to get out of bed, walk around indoors and cough well. You may stop using the incentive spirometer when instructed by your caregiver.  RISKS AND COMPLICATIONS  Take your time so you do not get dizzy or light-headed.  If you are in pain, you may need to take or ask for pain medication before doing incentive spirometry. It is harder to take a deep breath if you are having pain. AFTER USE  Rest and breathe slowly and easily.  It can be helpful to keep track of a log of your progress. Your caregiver can  provide you with a simple table to help with this. If you are using the spirometer at home, follow these instructions: SEEK MEDICAL CARE IF:   You are having difficultly using the spirometer.  You have trouble using the spirometer as often as instructed.  Your pain medication is not giving enough relief while using the spirometer.  You develop fever of 100.5 F (38.1 C) or higher. SEEK IMMEDIATE MEDICAL CARE IF:   You cough up bloody sputum that had not been present before.  You develop fever of 102 F (38.9 C) or greater.  You develop worsening pain at or near the incision site. MAKE SURE YOU:   Understand these instructions.  Will watch your condition.  Will get help right away if you are not doing well or get worse. Document Released: 02/22/2007 Document Revised: 01/04/2012 Document Reviewed: 04/25/2007 [facility] Patient Information 2014 [place], [place].   ________________________________________________________________________

## 2018-04-12 ENCOUNTER — Other Ambulatory Visit: Payer: Self-pay

## 2018-04-12 ENCOUNTER — Encounter (HOSPITAL_COMMUNITY)
Admission: RE | Admit: 2018-04-12 | Discharge: 2018-04-12 | Disposition: A | Discharge status: Home / Self Care | Payer: Self-pay | Source: Ambulatory Visit | Attending: Surgery | Admitting: Surgery

## 2018-04-12 ENCOUNTER — Encounter (HOSPITAL_COMMUNITY): Payer: Self-pay

## 2018-04-12 DIAGNOSIS — Z01810 Encounter for preprocedural cardiovascular examination: Secondary | ICD-10-CM | POA: Insufficient documentation

## 2018-04-12 DIAGNOSIS — Z01812 Encounter for preprocedural laboratory examination: Secondary | ICD-10-CM | POA: Insufficient documentation

## 2018-04-12 DIAGNOSIS — R000 Tachycardia, unspecified: Secondary | ICD-10-CM | POA: Insufficient documentation

## 2018-04-12 DIAGNOSIS — I4510 Unspecified right bundle-branch block: Secondary | ICD-10-CM | POA: Insufficient documentation

## 2018-04-12 DIAGNOSIS — K5792 Diverticulitis of intestine, part unspecified, without perforation or abscess without bleeding: Secondary | ICD-10-CM | POA: Insufficient documentation

## 2018-04-12 HISTORY — DX: Major depressive disorder, single episode, unspecified: F32.9

## 2018-04-12 HISTORY — DX: Depression, unspecified: F32.A

## 2018-04-12 LAB — CBC
HEMATOCRIT: 42.4 % (ref 39.0–52.0)
HEMOGLOBIN: 13.6 g/dL (ref 13.0–17.0)
MCH: 24.6 pg — AB (ref 26.0–34.0)
MCHC: 32.1 g/dL (ref 30.0–36.0)
MCV: 76.7 fL — AB (ref 78.0–100.0)
Platelets: 283 10*3/uL (ref 150–400)
RBC: 5.53 MIL/uL (ref 4.22–5.81)
RDW: 15.2 % (ref 11.5–15.5)
WBC: 13.3 10*3/uL — ABNORMAL HIGH (ref 4.0–10.5)

## 2018-04-12 LAB — BASIC METABOLIC PANEL
ANION GAP: 14 (ref 5–15)
BUN: 12 mg/dL (ref 6–20)
CO2: 23 mmol/L (ref 22–32)
Calcium: 9.6 mg/dL (ref 8.9–10.3)
Chloride: 102 mmol/L (ref 101–111)
Creatinine, Ser: 1.11 mg/dL (ref 0.61–1.24)
GFR calc Af Amer: >60 mL/min (ref >60)
GFR calc non Af Amer: >60 mL/min (ref >60)
Glucose, Bld: 106 mg/dL — ABNORMAL HIGH (ref 65–99)
POTASSIUM: 4 mmol/L (ref 3.5–5.1)
Sodium: 139 mmol/L (ref 135–145)

## 2018-04-12 LAB — HEMOGLOBIN A1C
HEMOGLOBIN A1C: 5.8 % — AB (ref 4.8–5.6)
MEAN PLASMA GLUCOSE: 119.76 mg/dL

## 2018-04-12 LAB — ABO/RH: ABO/RH(D): O POS

## 2018-04-12 NOTE — Consult Note (Signed)
WOC Nurse requested for preoperative stoma site marking Patient presents with Colonic diverticular abscess with noted fistula to his bladder Notes indicate possible ostomy, so will mark in both quadrants.   Discussed surgical procedure and stoma creation with patient and family.  Explained role of the WOC nurse team.  Provided the patient with educational booklet and provided samples of pouching options.  Answered patient and family questions.   Examined patient lying, sitting, and standing in order to place the marking in the patient's visual field, away from any creases or abdominal contour issues and within the rectus muscle. Patient has RLQ drain in place and considered this in marking as well.  Marked for colostomy in the LLQ  4 cm to the left of the umbilicus and 5 cm below the umbilicus.  Marked for ileostomy in the RLQ  4 cm to the right of the umbilicus and  4 cm below the umbilicus.   Patient's abdomen cleansed with CHG wipes at site markings, allowed to air dry prior to marking.Covered mark with thin film transparent dressing to preserve mark until date of surgery.   WOC Nurse team will follow up with patient after surgery for continue ostomy care and teaching.    Robert HudsonKaren Clydell Sposito RN BSN CWON Pager [phone]

## 2018-04-18 ENCOUNTER — Other Ambulatory Visit: Payer: Self-pay | Admitting: Urology

## 2018-04-19 MED ORDER — BUPIVACAINE LIPOSOME 1.3 % IJ SUSP
20.0000 mL | Freq: Once | INTRAMUSCULAR | Status: DC
  Filled 2018-04-19: qty 20

## 2018-04-20 ENCOUNTER — Encounter (HOSPITAL_COMMUNITY): Payer: Self-pay

## 2018-04-20 ENCOUNTER — Encounter (HOSPITAL_COMMUNITY)
Admission: RE | Disposition: A | Discharge status: Home / Self Care | Payer: Self-pay | Source: Home / Self Care | Attending: Surgery

## 2018-04-20 ENCOUNTER — Inpatient Hospital Stay (HOSPITAL_COMMUNITY): Payer: Self-pay | Admitting: Registered Nurse

## 2018-04-20 ENCOUNTER — Inpatient Hospital Stay (HOSPITAL_COMMUNITY): Payer: Self-pay

## 2018-04-20 ENCOUNTER — Other Ambulatory Visit: Payer: Self-pay

## 2018-04-20 ENCOUNTER — Inpatient Hospital Stay (HOSPITAL_COMMUNITY)
Admission: RE | Admit: 2018-04-20 | Discharge: 2018-04-24 | DRG: 330 | Disposition: A | Discharge status: Home / Self Care | Payer: Self-pay | Attending: General Surgery | Admitting: General Surgery

## 2018-04-20 DIAGNOSIS — Z833 Family history of diabetes mellitus: Secondary | ICD-10-CM

## 2018-04-20 DIAGNOSIS — K5720 Diverticulitis of large intestine with perforation and abscess without bleeding: Principal | ICD-10-CM | POA: Diagnosis present

## 2018-04-20 DIAGNOSIS — Z9049 Acquired absence of other specified parts of digestive tract: Secondary | ICD-10-CM

## 2018-04-20 DIAGNOSIS — F329 Major depressive disorder, single episode, unspecified: Secondary | ICD-10-CM | POA: Diagnosis present

## 2018-04-20 DIAGNOSIS — I10 Essential (primary) hypertension: Secondary | ICD-10-CM | POA: Diagnosis present

## 2018-04-20 DIAGNOSIS — N321 Vesicointestinal fistula: Secondary | ICD-10-CM | POA: Diagnosis present

## 2018-04-20 DIAGNOSIS — Z6826 Body mass index (BMI) 26.0-26.9, adult: Secondary | ICD-10-CM

## 2018-04-20 DIAGNOSIS — F419 Anxiety disorder, unspecified: Secondary | ICD-10-CM | POA: Diagnosis present

## 2018-04-20 DIAGNOSIS — K648 Other hemorrhoids: Secondary | ICD-10-CM | POA: Diagnosis present

## 2018-04-20 DIAGNOSIS — E669 Obesity, unspecified: Secondary | ICD-10-CM | POA: Diagnosis present

## 2018-04-20 HISTORY — PX: LAPAROSCOPIC SIGMOID COLECTOMY: SHX5928

## 2018-04-20 HISTORY — PX: FLEXIBLE SIGMOIDOSCOPY: SHX5431

## 2018-04-20 HISTORY — PX: CYSTOSCOPY WITH STENT PLACEMENT: SHX5790

## 2018-04-20 LAB — CBC
HCT: 39.4 % (ref 39.0–52.0)
Hemoglobin: 12.2 g/dL — ABNORMAL LOW (ref 13.0–17.0)
MCH: 23.9 pg — AB (ref 26.0–34.0)
MCHC: 31 g/dL (ref 30.0–36.0)
MCV: 77.1 fL — AB (ref 78.0–100.0)
PLATELETS: 478 10*3/uL — AB (ref 150–400)
RBC: 5.11 MIL/uL (ref 4.22–5.81)
RDW: 15.1 % (ref 11.5–15.5)
WBC: 15.9 10*3/uL — AB (ref 4.0–10.5)

## 2018-04-20 LAB — CREATININE, SERUM
CREATININE: 0.93 mg/dL (ref 0.61–1.24)
GFR calc non Af Amer: >60 mL/min (ref >60)

## 2018-04-20 LAB — TYPE AND SCREEN
ABO/RH(D): O POS
Antibody Screen: NEGATIVE

## 2018-04-20 SURGERY — COLECTOMY, SIGMOID, LAPAROSCOPIC
Anesthesia: General | Site: Ureter

## 2018-04-20 MED ORDER — ONDANSETRON HCL 4 MG/2ML IJ SOLN
INTRAMUSCULAR | Status: DC | PRN
  Administered 2018-04-20: 4 mg via INTRAVENOUS

## 2018-04-20 MED ORDER — OXYCODONE HCL 5 MG/5ML PO SOLN
5.0000 mg | Freq: Once | ORAL | Status: DC | PRN
  Filled 2018-04-20: qty 5

## 2018-04-20 MED ORDER — ALVIMOPAN 12 MG PO CAPS
12.0000 mg | ORAL_CAPSULE | Freq: Two times a day (BID) | ORAL | Status: DC
  Administered 2018-04-21 – 2018-04-23 (×4): 12 mg via ORAL
  Filled 2018-04-20 (×6): qty 1

## 2018-04-20 MED ORDER — FENTANYL CITRATE (PF) 250 MCG/5ML IJ SOLN
INTRAMUSCULAR | Status: AC
  Filled 2018-04-20: qty 5

## 2018-04-20 MED ORDER — PROMETHAZINE HCL 25 MG/ML IJ SOLN: 6.2500 mg | INTRAMUSCULAR | Status: DC | PRN

## 2018-04-20 MED ORDER — HYDRALAZINE HCL 20 MG/ML IJ SOLN: 10.0000 mg | INTRAMUSCULAR | Status: DC | PRN

## 2018-04-20 MED ORDER — HYDROMORPHONE HCL 1 MG/ML IJ SOLN
0.2500 mg | INTRAMUSCULAR | Status: DC | PRN
  Administered 2018-04-20 (×2): 0.5 mg via INTRAVENOUS

## 2018-04-20 MED ORDER — ROCURONIUM BROMIDE 10 MG/ML (PF) SYRINGE
PREFILLED_SYRINGE | INTRAVENOUS | Status: DC | PRN
  Administered 2018-04-20 (×3): 20 mg via INTRAVENOUS
  Administered 2018-04-20: 60 mg via INTRAVENOUS

## 2018-04-20 MED ORDER — HYDROMORPHONE HCL 1 MG/ML IJ SOLN
INTRAMUSCULAR | Status: AC
  Filled 2018-04-20: qty 1

## 2018-04-20 MED ORDER — ONDANSETRON HCL 4 MG PO TABS
4.0000 mg | ORAL_TABLET | Freq: Four times a day (QID) | ORAL | Status: DC | PRN
Start: 2018-04-20 — End: 2018-04-24

## 2018-04-20 MED ORDER — ACETAMINOPHEN 500 MG PO TABS
1000.0000 mg | ORAL_TABLET | Freq: Four times a day (QID) | ORAL | Status: DC
  Administered 2018-04-21 – 2018-04-24 (×13): 1000 mg via ORAL
  Filled 2018-04-20 (×12): qty 2

## 2018-04-20 MED ORDER — ONDANSETRON HCL 4 MG/2ML IJ SOLN
INTRAMUSCULAR | Status: AC
  Filled 2018-04-20: qty 2

## 2018-04-20 MED ORDER — SACCHAROMYCES BOULARDII 250 MG PO CAPS
250.0000 mg | ORAL_CAPSULE | Freq: Two times a day (BID) | ORAL | Status: DC
  Administered 2018-04-20 – 2018-04-23 (×7): 250 mg via ORAL
  Filled 2018-04-20 (×7): qty 1

## 2018-04-20 MED ORDER — BUPIVACAINE-EPINEPHRINE (PF) 0.5% -1:200000 IJ SOLN
INTRAMUSCULAR | Status: AC
  Filled 2018-04-20: qty 30

## 2018-04-20 MED ORDER — DEXAMETHASONE SODIUM PHOSPHATE 10 MG/ML IJ SOLN
INTRAMUSCULAR | Status: DC | PRN
  Administered 2018-04-20: 10 mg via INTRAVENOUS

## 2018-04-20 MED ORDER — ALUM & MAG HYDROXIDE-SIMETH 200-200-20 MG/5ML PO SUSP: 30.0000 mL | Freq: Four times a day (QID) | ORAL | Status: DC | PRN

## 2018-04-20 MED ORDER — CHLORHEXIDINE GLUCONATE CLOTH 2 % EX PADS: 6.0000 | MEDICATED_PAD | Freq: Once | CUTANEOUS | Status: DC

## 2018-04-20 MED ORDER — DIPHENHYDRAMINE HCL 12.5 MG/5ML PO ELIX: 12.5000 mg | ORAL_SOLUTION | Freq: Four times a day (QID) | ORAL | Status: DC | PRN

## 2018-04-20 MED ORDER — CEFOTETAN DISODIUM 2 G IJ SOLR
2.0000 g | INTRAMUSCULAR | Status: AC
  Administered 2018-04-20: 2 g via INTRAVENOUS
  Filled 2018-04-20: qty 2

## 2018-04-20 MED ORDER — ENSURE SURGERY PO LIQD
237.0000 mL | Freq: Two times a day (BID) | ORAL | Status: DC
  Administered 2018-04-21 – 2018-04-23 (×6): 237 mL via ORAL
  Filled 2018-04-20 (×8): qty 237

## 2018-04-20 MED ORDER — PROPOFOL 10 MG/ML IV BOLUS
INTRAVENOUS | Status: DC | PRN
  Administered 2018-04-20: 200 mg via INTRAVENOUS

## 2018-04-20 MED ORDER — GABAPENTIN 300 MG PO CAPS
300.0000 mg | ORAL_CAPSULE | ORAL | Status: AC
  Administered 2018-04-20: 300 mg via ORAL
  Filled 2018-04-20: qty 1

## 2018-04-20 MED ORDER — MEPERIDINE HCL 50 MG/ML IJ SOLN
6.2500 mg | INTRAMUSCULAR | Status: DC | PRN
  Administered 2018-04-20 (×2): 12.5 mg via INTRAVENOUS

## 2018-04-20 MED ORDER — SUGAMMADEX SODIUM 200 MG/2ML IV SOLN
INTRAVENOUS | Status: AC
  Filled 2018-04-20: qty 2

## 2018-04-20 MED ORDER — BUPIVACAINE LIPOSOME 1.3 % IJ SUSP
INTRAMUSCULAR | Status: DC | PRN
  Administered 2018-04-20: 20 mL

## 2018-04-20 MED ORDER — MIDAZOLAM HCL 5 MG/5ML IJ SOLN
INTRAMUSCULAR | Status: DC | PRN
  Administered 2018-04-20: 2 mg via INTRAVENOUS

## 2018-04-20 MED ORDER — LACTATED RINGERS IV SOLN
INTRAVENOUS | Status: DC
  Administered 2018-04-20: 18:00:00 via INTRAVENOUS
  Administered 2018-04-21: 75 mL/h via INTRAVENOUS

## 2018-04-20 MED ORDER — HEPARIN SODIUM (PORCINE) 5000 UNIT/ML IJ SOLN
5000.0000 [IU] | Freq: Three times a day (TID) | INTRAMUSCULAR | Status: DC
  Administered 2018-04-21 – 2018-04-23 (×9): 5000 [IU] via SUBCUTANEOUS
  Filled 2018-04-20 (×10): qty 1

## 2018-04-20 MED ORDER — LIDOCAINE HCL 2 % IJ SOLN
INTRAMUSCULAR | Status: AC
  Filled 2018-04-20: qty 20

## 2018-04-20 MED ORDER — LACTATED RINGERS IV SOLN
INTRAVENOUS | Status: DC
  Administered 2018-04-20 (×2): via INTRAVENOUS

## 2018-04-20 MED ORDER — ALVIMOPAN 12 MG PO CAPS
12.0000 mg | ORAL_CAPSULE | ORAL | Status: AC
  Administered 2018-04-20: 12 mg via ORAL
  Filled 2018-04-20: qty 1

## 2018-04-20 MED ORDER — KETOROLAC TROMETHAMINE 30 MG/ML IJ SOLN: 15.0000 mg | INTRAMUSCULAR | Status: DC

## 2018-04-20 MED ORDER — HEPARIN SODIUM (PORCINE) 5000 UNIT/ML IJ SOLN
5000.0000 [IU] | Freq: Once | INTRAMUSCULAR | Status: AC
  Administered 2018-04-20: 5000 [IU] via SUBCUTANEOUS
  Filled 2018-04-20: qty 1

## 2018-04-20 MED ORDER — DEXAMETHASONE SODIUM PHOSPHATE 10 MG/ML IJ SOLN
INTRAMUSCULAR | Status: AC
Start: 2018-04-20 — End: ?
  Filled 2018-04-20: qty 1

## 2018-04-20 MED ORDER — LIDOCAINE 2% (20 MG/ML) 5 ML SYRINGE
INTRAMUSCULAR | Status: AC
  Filled 2018-04-20: qty 5

## 2018-04-20 MED ORDER — STERILE WATER FOR IRRIGATION IR SOLN
Status: DC | PRN
  Administered 2018-04-20: 300 mL via INTRAVESICAL

## 2018-04-20 MED ORDER — DIPHENHYDRAMINE HCL 50 MG/ML IJ SOLN: 12.5000 mg | Freq: Four times a day (QID) | INTRAMUSCULAR | Status: DC | PRN

## 2018-04-20 MED ORDER — ACETAMINOPHEN 500 MG PO TABS
1000.0000 mg | ORAL_TABLET | ORAL | Status: AC
  Administered 2018-04-20: 1000 mg via ORAL
  Filled 2018-04-20: qty 2

## 2018-04-20 MED ORDER — SUGAMMADEX SODIUM 200 MG/2ML IV SOLN
INTRAVENOUS | Status: DC | PRN
  Administered 2018-04-20: 200 mg via INTRAVENOUS

## 2018-04-20 MED ORDER — HYDROMORPHONE HCL 1 MG/ML IJ SOLN
0.5000 mg | INTRAMUSCULAR | Status: DC | PRN
  Administered 2018-04-20: 0.5 mg via INTRAVENOUS
  Filled 2018-04-20: qty 0.5

## 2018-04-20 MED ORDER — FENTANYL CITRATE (PF) 250 MCG/5ML IJ SOLN
INTRAMUSCULAR | Status: DC | PRN
  Administered 2018-04-20: 50 ug via INTRAVENOUS
  Administered 2018-04-20: 100 ug via INTRAVENOUS
  Administered 2018-04-20 (×2): 50 ug via INTRAVENOUS

## 2018-04-20 MED ORDER — BUPIVACAINE-EPINEPHRINE (PF) 0.25% -1:200000 IJ SOLN
INTRAMUSCULAR | Status: AC
  Filled 2018-04-20: qty 30

## 2018-04-20 MED ORDER — TRAMADOL HCL 50 MG PO TABS
50.0000 mg | ORAL_TABLET | Freq: Four times a day (QID) | ORAL | Status: DC | PRN
  Administered 2018-04-21 (×2): 50 mg via ORAL
  Filled 2018-04-20 (×2): qty 1

## 2018-04-20 MED ORDER — HYDRALAZINE HCL 20 MG/ML IJ SOLN
5.0000 mg | Freq: Once | INTRAMUSCULAR | Status: AC
  Administered 2018-04-20: 5 mg via INTRAVENOUS

## 2018-04-20 MED ORDER — IOHEXOL 300 MG/ML  SOLN
INTRAMUSCULAR | Status: DC | PRN
  Administered 2018-04-20: 16 mL via URETHRAL

## 2018-04-20 MED ORDER — POLYETHYLENE GLYCOL 3350 17 GM/SCOOP PO POWD: 1.0000 | Freq: Once | ORAL | Status: DC

## 2018-04-20 MED ORDER — SODIUM CHLORIDE 0.9 % IJ SOLN
INTRAMUSCULAR | Status: AC
  Filled 2018-04-20: qty 20

## 2018-04-20 MED ORDER — LIDOCAINE 2% (20 MG/ML) 5 ML SYRINGE
INTRAMUSCULAR | Status: DC | PRN
  Administered 2018-04-20: 100 mg via INTRAVENOUS

## 2018-04-20 MED ORDER — OXYCODONE HCL 5 MG PO TABS: 5.0000 mg | ORAL_TABLET | Freq: Once | ORAL | Status: DC | PRN

## 2018-04-20 MED ORDER — IBUPROFEN 200 MG PO TABS
600.0000 mg | ORAL_TABLET | Freq: Four times a day (QID) | ORAL | Status: DC
  Administered 2018-04-20 – 2018-04-22 (×5): 600 mg via ORAL
  Filled 2018-04-20 (×5): qty 3

## 2018-04-20 MED ORDER — NEOMYCIN SULFATE 500 MG PO TABS: 1000.0000 mg | ORAL_TABLET | ORAL | Status: DC

## 2018-04-20 MED ORDER — ROCURONIUM BROMIDE 100 MG/10ML IV SOLN
INTRAVENOUS | Status: AC
  Filled 2018-04-20: qty 1

## 2018-04-20 MED ORDER — 0.9 % SODIUM CHLORIDE (POUR BTL) OPTIME
TOPICAL | Status: DC | PRN
  Administered 2018-04-20: 2000 mL

## 2018-04-20 MED ORDER — MIDAZOLAM HCL 2 MG/2ML IJ SOLN
INTRAMUSCULAR | Status: AC
  Filled 2018-04-20: qty 2

## 2018-04-20 MED ORDER — KETAMINE HCL 10 MG/ML IJ SOLN
INTRAMUSCULAR | Status: DC | PRN
  Administered 2018-04-20: 20 mg via INTRAVENOUS
  Administered 2018-04-20: 35 mg via INTRAVENOUS

## 2018-04-20 MED ORDER — BUPIVACAINE-EPINEPHRINE (PF) 0.25% -1:200000 IJ SOLN
INTRAMUSCULAR | Status: DC | PRN
  Administered 2018-04-20: 30 mL

## 2018-04-20 MED ORDER — METRONIDAZOLE 500 MG PO TABS: 1000.0000 mg | ORAL_TABLET | ORAL | Status: DC

## 2018-04-20 MED ORDER — KETAMINE HCL 10 MG/ML IJ SOLN
INTRAMUSCULAR | Status: AC
Start: 2018-04-20 — End: ?
  Filled 2018-04-20: qty 1

## 2018-04-20 MED ORDER — HEPARIN SODIUM (PORCINE) 5000 UNIT/ML IJ SOLN: 5000.0000 [IU] | Freq: Three times a day (TID) | INTRAMUSCULAR | Status: DC

## 2018-04-20 MED ORDER — ONDANSETRON HCL 4 MG/2ML IJ SOLN: 4.0000 mg | Freq: Four times a day (QID) | INTRAMUSCULAR | Status: DC | PRN

## 2018-04-20 MED ORDER — HYDRALAZINE HCL 20 MG/ML IJ SOLN
INTRAMUSCULAR | Status: AC
  Filled 2018-04-20: qty 1

## 2018-04-20 MED ORDER — METHYLENE BLUE 0.5 % INJ SOLN
INTRAVENOUS | Status: AC
Start: 2018-04-20 — End: ?
  Filled 2018-04-20: qty 10

## 2018-04-20 MED ORDER — LIDOCAINE 2% (20 MG/ML) 5 ML SYRINGE
INTRAMUSCULAR | Status: DC | PRN
  Administered 2018-04-20: 1.5 mg/kg/h via INTRAVENOUS

## 2018-04-20 MED ORDER — MEPERIDINE HCL 50 MG/ML IJ SOLN
INTRAMUSCULAR | Status: AC
  Filled 2018-04-20: qty 1

## 2018-04-20 SURGICAL SUPPLY — 91 items
ADAPTER GOLDBERG URETERAL (ADAPTER) ×4 IMPLANT
APPLIER CLIP 5 13 M/L LIGAMAX5 (MISCELLANEOUS)
APPLIER CLIP ROT 10 11.4 M/L (STAPLE)
BAG URO CATCHER STRL LF (MISCELLANEOUS) ×4 IMPLANT
BASKET ZERO TIP NITINOL 2.4FR (BASKET) IMPLANT
BLADE EXTENDED COATED 6.5IN (ELECTRODE) IMPLANT
CABLE HIGH FREQUENCY MONO STRZ (ELECTRODE) IMPLANT
CATH FOLEY 3WAY 30CC 16FR (CATHETERS) ×4 IMPLANT
CATH INTERMIT  6FR 70CM (CATHETERS) ×4 IMPLANT
CATH MUSHROOM 28FR (CATHETERS) IMPLANT
CATH MUSHROOM 30FR (CATHETERS) IMPLANT
CATH URET 5FR 28IN OPEN ENDED (CATHETERS) ×4 IMPLANT
CELLS DAT CNTRL 66122 CELL SVR (MISCELLANEOUS) IMPLANT
CLIP APPLIE 5 13 M/L LIGAMAX5 (MISCELLANEOUS) IMPLANT
CLIP APPLIE ROT 10 11.4 M/L (STAPLE) IMPLANT
CLOTH BEACON ORANGE TIMEOUT ST (SAFETY) ×4 IMPLANT
COVER FOOTSWITCH UNIV (MISCELLANEOUS) IMPLANT
COVER SURGICAL LIGHT HANDLE (MISCELLANEOUS) ×4 IMPLANT
DECANTER SPIKE VIAL GLASS SM (MISCELLANEOUS) ×4 IMPLANT
DISSECTOR BLUNT TIP ENDO 5MM (MISCELLANEOUS) IMPLANT
DRAIN CHANNEL 19F RND (DRAIN) IMPLANT
DRAPE SURG IRRIG POUCH 19X23 (DRAPES) ×4 IMPLANT
DRSG OPSITE POSTOP 4X10 (GAUZE/BANDAGES/DRESSINGS) IMPLANT
DRSG OPSITE POSTOP 4X6 (GAUZE/BANDAGES/DRESSINGS) ×4 IMPLANT
DRSG OPSITE POSTOP 4X8 (GAUZE/BANDAGES/DRESSINGS) IMPLANT
DRSG TEGADERM 2-3/8X2-3/4 SM (GAUZE/BANDAGES/DRESSINGS) ×4 IMPLANT
DRSG TEGADERM 4X4.75 (GAUZE/BANDAGES/DRESSINGS) ×4 IMPLANT
ELECT REM PT RETURN 15FT ADLT (MISCELLANEOUS) ×4 IMPLANT
EVACUATOR SILICONE 100CC (DRAIN) IMPLANT
GAUZE SPONGE 2X2 8PLY STRL LF (GAUZE/BANDAGES/DRESSINGS) ×2 IMPLANT
GAUZE SPONGE 4X4 12PLY STRL (GAUZE/BANDAGES/DRESSINGS) IMPLANT
GLOVE BIO SURGEON STRL SZ7.5 (GLOVE) ×8 IMPLANT
GLOVE BIOGEL M STRL SZ7.5 (GLOVE) ×4 IMPLANT
GLOVE INDICATOR 8.0 STRL GRN (GLOVE) ×8 IMPLANT
GOWN STRL REUS W/TWL LRG LVL3 (GOWN DISPOSABLE) ×8 IMPLANT
GOWN STRL REUS W/TWL XL LVL3 (GOWN DISPOSABLE) ×16 IMPLANT
GUIDEWIRE ANG ZIPWIRE 038X150 (WIRE) ×8 IMPLANT
GUIDEWIRE STR DUAL SENSOR (WIRE) IMPLANT
HOLDER FOLEY CATH W/STRAP (MISCELLANEOUS) ×4 IMPLANT
KIT SIGMOIDOSCOPE (SET/KITS/TRAYS/PACK) ×4 IMPLANT
LIGASURE IMPACT 36 18CM CVD LR (INSTRUMENTS) IMPLANT
MANIFOLD NEPTUNE II (INSTRUMENTS) ×8 IMPLANT
NEEDLE INSUFFLATION 14GA 120MM (NEEDLE) IMPLANT
PACK COLON (CUSTOM PROCEDURE TRAY) ×4 IMPLANT
PACK CYSTO (CUSTOM PROCEDURE TRAY) ×4 IMPLANT
PAD POSITIONING PINK XL (MISCELLANEOUS) ×4 IMPLANT
PORT LAP GEL ALEXIS MED 5-9CM (MISCELLANEOUS) ×4 IMPLANT
RELOAD STAPLER GREEN 60MM (STAPLE) ×2 IMPLANT
RTRCTR WOUND ALEXIS 18CM MED (MISCELLANEOUS)
SCISSORS LAP 5X35 DISP (ENDOMECHANICALS) ×4 IMPLANT
SEALER TISSUE G2 STRG ARTC 35C (ENDOMECHANICALS) ×4 IMPLANT
SET IRRIG TUBING LAPAROSCOPIC (IRRIGATION / IRRIGATOR) ×4 IMPLANT
SET IRRIG Y TYPE TUR BLADDER L (SET/KITS/TRAYS/PACK) ×4 IMPLANT
SLEEVE ADV FIXATION 5X100MM (TROCAR) ×8 IMPLANT
SPONGE DRAIN TRACH 4X4 STRL 2S (GAUZE/BANDAGES/DRESSINGS) IMPLANT
SPONGE GAUZE 2X2 STER 10/PKG (GAUZE/BANDAGES/DRESSINGS) ×2
STAPLER AUT SUT 4.8 EEAXL 31 (STAPLE) ×4 IMPLANT
STAPLER ECHELON LONG 60 440 (INSTRUMENTS) ×4 IMPLANT
STAPLER RELOAD GREEN 60MM (STAPLE) ×4
STAPLER VISISTAT 35W (STAPLE) IMPLANT
SUT ETHILON 3 0 PS 1 (SUTURE) IMPLANT
SUT PDS AB 1 CTX 36 (SUTURE) IMPLANT
SUT PDS AB 1 TP1 54 (SUTURE) IMPLANT
SUT PDS AB 1 TP1 96 (SUTURE) IMPLANT
SUT PROLENE 2 0 KS (SUTURE) ×8 IMPLANT
SUT PROLENE 2 0 SH DA (SUTURE) ×4 IMPLANT
SUT SILK 2 0 (SUTURE) ×2
SUT SILK 2 0 SH CR/8 (SUTURE) ×4 IMPLANT
SUT SILK 2-0 18XBRD TIE 12 (SUTURE) ×2 IMPLANT
SUT SILK 3 0 (SUTURE) ×2
SUT SILK 3 0 SH CR/8 (SUTURE) ×4 IMPLANT
SUT SILK 3-0 18XBRD TIE 12 (SUTURE) ×2 IMPLANT
SUT VIC AB 2-0 SH 27 (SUTURE)
SUT VIC AB 2-0 SH 27X BRD (SUTURE) IMPLANT
SUT VIC AB 3-0 SH 18 (SUTURE) IMPLANT
SUT VIC AB 3-0 SH 27 (SUTURE)
SUT VIC AB 3-0 SH 27X BRD (SUTURE) IMPLANT
SUT VICRYL 2 0 18  UND BR (SUTURE) ×2
SUT VICRYL 2 0 18 UND BR (SUTURE) ×2 IMPLANT
SYS LAPSCP GELPORT 120MM (MISCELLANEOUS)
SYSTEM LAPSCP GELPORT 120MM (MISCELLANEOUS) IMPLANT
TOWEL OR 17X26 10 PK STRL BLUE (TOWEL DISPOSABLE) IMPLANT
TOWEL OR NON WOVEN STRL DISP B (DISPOSABLE) ×4 IMPLANT
TRAY FOLEY MTR SLVR 14FR STAT (SET/KITS/TRAYS/PACK) IMPLANT
TRAY FOLEY MTR SLVR 16FR STAT (SET/KITS/TRAYS/PACK) IMPLANT
TRAY IRRIG W/60CC SYR STRL (SET/KITS/TRAYS/PACK) IMPLANT
TROCAR ADV FIXATION 5X100MM (TROCAR) ×4 IMPLANT
TROCAR XCEL BLUNT TIP 100MML (ENDOMECHANICALS) ×4 IMPLANT
TUBING CONNECTING 10 (TUBING) ×3 IMPLANT
TUBING CONNECTING 10' (TUBING) ×1
TUBING INSUF HEATED (TUBING) ×4 IMPLANT

## 2018-04-20 NOTE — Anesthesia Preprocedure Evaluation (Signed)
Anesthesia Evaluation  Patient identified by MRN, date of birth, ID band Patient awake    Reviewed: Allergy & Precautions, NPO status , Patient's Chart, lab work & pertinent test results  Airway Mallampati: II  TM Distance: >3 FB Neck ROM: Full    Dental no notable dental hx.    Pulmonary neg pulmonary ROS,    Pulmonary exam normal breath sounds clear to auscultation       Cardiovascular hypertension, negative cardio ROS Normal cardiovascular exam Rhythm:Regular Rate:Normal     Neuro/Psych Depression negative neurological ROS  negative psych ROS   GI/Hepatic negative GI ROS, Neg liver ROS,   Endo/Other  negative endocrine ROS  Renal/GU negative Renal ROS  negative genitourinary   Musculoskeletal negative musculoskeletal ROS (+)   Abdominal   Peds negative pediatric ROS (+)  Hematology negative hematology ROS (+)   Anesthesia Other Findings   Reproductive/Obstetrics negative OB ROS                             Anesthesia Physical Anesthesia Plan  ASA: II  Anesthesia Plan: General   Post-op Pain Management:    Induction: Intravenous  PONV Risk Score and Plan: 2 and Ondansetron and Midazolam  Airway Management Planned: Oral ETT  Additional Equipment:   Intra-op Plan:   Post-operative Plan: Extubation in OR  Informed Consent: I have reviewed the patients History and Physical, chart, labs and discussed the procedure including the risks, benefits and alternatives for the proposed anesthesia with the patient or authorized representative who has indicated his/her understanding and acceptance.   Dental advisory given  Plan Discussed with: CRNA  Anesthesia Plan Comments:         Anesthesia Quick Evaluation

## 2018-04-20 NOTE — Transfer of Care (Signed)
Immediate Anesthesia Transfer of Care Note  Patient: Sullivan LoneFrancisco Troupe  Procedure(s) Performed: LAPAROSCOPIC  SIGMOIDECTOMY ,COLOPROSTOSTOMY, REMOVAL OF STENTS (N/A ) FLEXIBLE SIGMOIDOSCOPY (N/A ) CYSTOSCOPY WITH URETERAL STENT PLACEMENT (Bilateral Ureter)  Patient Location: PACU  Anesthesia Type:General  Level of Consciousness: sedated  Airway & Oxygen Therapy: Patient Spontanous Breathing and Patient connected to face mask oxygen  Post-op Assessment: Report given to RN and Post -op Vital signs reviewed and stable  Post vital signs: Reviewed and stable  Last Vitals:  Vitals Value Taken Time  BP    Temp    Pulse    Resp    SpO2      Last Pain:  Vitals:   04/20/18 1128  TempSrc:   PainSc: 0-No pain         Complications: No apparent anesthesia complications

## 2018-04-20 NOTE — H&P (Signed)
Robert Chen is an 32 y.o. male.    Chief Complaint: Pre-OP Cystoscopy, Bilateral Ureteral Stent Placement, Colovesical Fistula Repair.   HPI:   1 - Severe Diverticulitis with Colovesical Fistula - severe episdoe diverticulitis with abscess req per drain 12/2017. Also with small dome CV fistula by imaging x several inlduding drain injection study last month.   Today "Robert Chen" is seen to proceed with segmental colon resection, CV fistula repair, Cysto / bilateral stent placement for severe diverticulitis. No fecent fevers.   Past Medical History:  Diagnosis Date  . Depression   . Diverticulitis   . Hypertension    hx of no meds    Past Surgical History:  Procedure Laterality Date  . COLONOSCOPY    . IR RADIOLOGIST EVAL & MGMT  01/18/2018  . IR RADIOLOGIST EVAL & MGMT  02/01/2018  . IR RADIOLOGIST EVAL & MGMT  02/22/2018  . IR RADIOLOGIST EVAL & MGMT  03/22/2018  . NO PAST SURGERIES      Family History  Problem Relation Age of Onset  . Diabetes Father    Social History:  reports that he has never smoked. He has never used smokeless tobacco. He reports that he does not drink alcohol or use drugs.  Allergies: No Known Allergies  No medications prior to admission.    No results found for this or any previous visit (from the past 48 hour(s)). No results found.  Review of Systems  Constitutional: Negative.  Negative for chills and fever.  HENT: Negative.   Eyes: Negative.   Respiratory: Negative.   Cardiovascular: Negative.   Gastrointestinal: Negative.   Genitourinary: Negative for flank pain and hematuria.  Musculoskeletal: Negative.   Skin: Negative.   Neurological: Negative.   Endo/Heme/Allergies: Negative.   Psychiatric/Behavioral: Negative.     There were no vitals taken for this visit. Physical Exam  Constitutional: He appears well-developed.  HENT:  Head: Normocephalic.  Eyes: Pupils are equal, round, and reactive to light.  Neck: Normal range of motion.   Cardiovascular: Normal rate.  Respiratory: Effort normal.  GI: Soft.  Genitourinary:  Genitourinary Comments: RLQ abscess drain in place.   Neurological: He is alert.  Skin: Skin is warm.  Psychiatric: He has a normal mood and affect.     Assessment/Plan   1 - Severe Diverticulitis with Colovesical Fistula - proceed as planned with cysto, bilateral retrogrades. I am available to help with more formal parts of CV fistula repair as well if needed.   Robert AcheMANNY, Robert Wehrli, MD 04/20/2018, 8:27 AM

## 2018-04-20 NOTE — Op Note (Signed)
PATIENT: Robert Chen  32 y.o. male  Patient Care Team: Evaristo BuryShambley, Ashleigh N, NP as PCP - General (Nurse Practitioner)  PREOP DIAGNOSIS: COMPLICATED DIVERTICULITIS, COLEVESICAL FISTULA  POSTOP DIAGNOSIS: COMPLICATED DIVERTICULITIS, COLEVESICAL FISTULA  PROCEDURE: 1. Laparoscopic assisted sigmoidectomy with double stapled coloproctostomy 2. Flexible sigmoidoscopy  SURGEON: Stephanie Couphristopher M. Keyarra Rendall, MD  ASSISTANT: Romie LeveeAlicia Thomas, MD  ANESTHESIA: General endotracheal  EBL: 50cc Total I/O In: 1600 [I.V.:1600] Out: 350 [Urine:300; Blood:50]  DRAINS: 19 Fr round blake left draining pelvis and anterior abdominal wall chronic cavity  SPECIMEN: Sigmoid colon  COUNTS: Sponge, needle and instrument counts were reported correct x2   FINDINGS:  Cystoscopy with Dr. Berneice HeinrichManny showed probable fistula on dome of bladder.  Intraoperatively, the sigmoid colon was adherent to the anterior abdominal wall at the superior aspect of the bladder.  The fistula was taken down hugging the colon.  A double stapled 31mm coloproctostomy was created at the location of the proximal rectum. This corresponded to 17cm from the anal verge on flexible sigmoidoscopy.  At this location, the tinea had already splayed, there were no appendices epiploicae, and it overlyed the sacral promontory.  After division of the proximal rectum, it retracted nicely into the pelvis. Flexible sigmoidoscopy leak test demonstrated a patent, hemostatic, pink, air-tight anastomosis. This was tension free.  The bladder was then backfilled with dilute methylene blue until the bladder was well distended and tense, approximately 300cc, and there was no evidence of leak.  STATEMENT OF MEDICAL NECESSITY: Robert Chen is a 32 y.o. male with history of complicated diverticulitis and colovesical fistula. [facility]04/18 with LLQ pain and increased frequency of urination. He was evaluated in the emergency room with CT abdomen and  pelvis 01/04/18 which showed acute sigmoid diverticulitis, complicated by adjacent phlegmonous change and 3.7 cm abscess which was inseparable from the right anterior superior bladder wall. He was started on antibiotics and underwent IR drainage. He recovered well from this and was discharged 01/08/18. He has undergone multiple IR evaluations of his drain which had most recently shown collapse of his abscess cavity and a persistent fistula to the urinary bladder from the drain.  He underwent colonoscopy with Dr. Bosie ClosSchooler that showed diverticulosis and some congested mucosa in the sigmoid as well as internal hemorrhoids but no polyps or other concerning findings.  Options were discussed and he opted to proceed with surgery.  Please refer to H&P for details regarding this discussion  NARRATIVE: Informed consent was verified. The patient was taken to the operating room, placed supine on the operating table and SCD's were applied. General endotracheal anesthesia was induced. The patient was then positioned in the lithotomy position with Allen stirrups. Pressure points were padded and verified.  Urology then scrubbed; the patient was prepped and draped for placement of ureteral stents and cystoscopy.  Antibiotics had been administered.  Cystoscopy/stents was then performed - please refer to Dr. Emmaline LifeManny's dictation for details regarding this portion of the procedure. The hair on the abdomen was clipped. The IR drain was cut and removed.  The patient's abdomen was then prepped and draped in the standard sterile fashion. Surgical timeout confirmed our plan.   Abdominal entry was gained using the [facility] technique.  An infraumbilical incision was made.  This was carried down to the umbilical stalk which was dissected and grasped with a Kocher clamp.  The umbilical stalk was retracted outwardly and the midline infraumbilical fascia incised.  The peritoneum was then bluntly entered.  A stay suture  of 0 Vicryl figure-of-eight  was then placed.  The Hasson trocar was inserted into the peritoneal cavity and insufflation commenced to of CO2.  Camera inspection revealed no injury. Bilateral TAP blocks were then created using a dilute mixture of Exparel + 0.25% marcaine with epinephrine.  Three additional 5mm ports were carefully placed under direct laparoscopic visualization.   The patient was positioned in Trendelenburg. The omentum and small bowel was reflected cephalad.  A lateral to medial approach was utilized.  The descending colon was mobilized up to the splenic flexure along the [name] line of Toldt. As the spleen was approached, the patient was repositioned in reverse Trendelenburg.  The splenic flexure was then partially taken down. At this point, the descending colon was able to be reflected medially to the midline and reached into the pelvis.  Attention was then turned to sigmoid colon mobilization. The patient was repositioned in Trendelenburg. The sigmoid colon where it was adherent to the abdominal wall at the location of the fistula was then taken down, hugging the colon and staying well off the bladder.  The sigmoid colon was then mobilized off the intersigmoid fossa and care was taken to protect the left ureter from injury.  The ureter and stent was identified and left in place along with the gonadal vessels - posterior to the mesentery.  The sigmoid mesentery was mobilized. The sigmoid colon was then elevated. The peritoneum overlying the distal branches of the IMA was scored and the peritoneum opened down onto the proximal rectum. The distal IMA branches were isolated and location of the left ureter confirmed to be down and posterior to where the plane of dissection was located. The mesentery including the distal IMA branches was divided with an EnSeal energy device. The intervening mesentery from the distal descending colon to the proximal rectum was divided with an Enseal device.  Adequate length of colon was  confirmed in the distal descending colon easily reached the proximal rectum without any tension.  Hemostasis was then again verified. The proximal rectum was identified at the location where the tinea had already splayed, there were no appendices epiploicae, and it overlyed the sacral promontory.  The proximal mesorectum was then cleared to the level of the rectal wall at the point of planned transection.  Using a Endo GIA green load stapler, the proximal rectum was divided.  The rectum then retracted nicely to the pelvis.  The abdomen was then desufflated. The Hasson cannula was removed and the umbilical incision extended inferiorly into a mini low midline incision. This was done as opposed to a Pfannenstiel due to the location of the chronic cavity/fistula to the superior dome of the bladder.  An Alexis wound protector was then placed.  Blue towels were then draped from the field.  The colon was then delivered through the wound.  The proximal point of division was selected on the descending colon.  At this level, the colon was healthy and there were no diverticula.  A window was clear in the mesentery along the wall of the descending colon and the intervening mesentery divided with the Enseal. At the level of division, there was a palpable pulse in the marginal artery and the colon was clearly well perfused.  A pursestring device was applied and the sigmoid colon was excised.  This was passed off the specimen with the stapled end being distal and the open end proximal.  2-0 Prolene on a Keith needle was then passed through the pursestring device.  The pursestring clamp was released and EEA sizers were used.  A 31 mm EEA was then selected.  The pursestring was secured to itself with additional 3-0 silk sutures.  The pursestring was then tied around the 31 mm EEA anvil.  There was some fat along the planned anastomosis line which was cleared carefully.  Hemostasis was verified.  The anvil and colon was placed back  in the abdomen, and Alexis cap was placed, and the abdomen is reinsufflated.  I then went below for passing the stapler. The descending colon reached into the pelvis without tension and remained there when let go. Under laparoscopic visualization, the stapler was passed. The spike was deployed just anterior to the staple line and the anvil mated. The stapler was closed, orientation of colon confirmed to ensure no twisting. The stapler was held and then fired.   Attention was then turned to performing the flexible sigmoidoscopy and leak test.  The colon proximally anastomosis was gently occluded laparoscopically. The pelvis filled with sterile saline.  The flexible sigmoidoscope was then passed under direct visualization and the anastomosis identified.  The rectum and colon were both pink.  The colon was insufflated and the anastomosis noted to be airtight.  Inspection of the anastomosis revealed it to be hemostatic.  This was located at approximately 17 cm from the anal verge by flexible sigmoidoscopy.  The air was evacuated from the colon and the scope removed.  The bladder was then backfilled with dilute methylene blue until it was tense which corresponded to approximately 300 cc of volume.  There was no evidence of bladder leak and it held the volume well.  I discussed the plan moving forward with our urologist, Dr. Urban Gibson, and the plan will be for a Foley catheter for 2 weeks followed by a CT cystogram prior to removal.  A 19 French round Blake drain was then placed lapper scopic Lee drain in the pelvis and anterior abdominal wall where the presumed fistula was located.  Omentum was then placed back in the pelvis. The pneumoperitoneum was then evacuated and the trochars were removed under direct visualization.  The low midline fascia was then closed with 2 running #1 looped PDS sutures.  Additional local anesthetic was infiltrated into the fascia at this level.  The wounds were then irrigated.  The skin  was then approximated with staples.  The drain was secured with a 2-0 nylon suture. The ureteral stents at the end of the case were then removed and the Foley catheter left in place.   An MD assistant was necessary for tissue manipulation, retraction and positioning due to the complexity of the case, obesity of the patient and hospital policies  DISPOSITION: PACU in satisfactory condition   Note: This dictation was prepared with Dragon/digital dictation along with [employer]. Any transcriptional errors that result from this process are unintentional.

## 2018-04-20 NOTE — Anesthesia Postprocedure Evaluation (Signed)
Anesthesia Post Note  Patient: Sullivan LoneFrancisco Klabunde  Procedure(s) Performed: LAPAROSCOPIC  SIGMOIDECTOMY ,COLOPROSTOSTOMY, REMOVAL OF STENTS (N/A ) FLEXIBLE SIGMOIDOSCOPY (N/A ) CYSTOSCOPY WITH URETERAL STENT PLACEMENT (Bilateral Ureter)     Patient location during evaluation: PACU Anesthesia Type: General Level of consciousness: awake and alert Pain management: pain level controlled Vital Signs Assessment: post-procedure vital signs reviewed and stable Respiratory status: spontaneous breathing, nonlabored ventilation and respiratory function stable Cardiovascular status: blood pressure returned to baseline and stable Postop Assessment: no apparent nausea or vomiting Anesthetic complications: no    Last Vitals:  Vitals:   04/20/18 1745 04/20/18 1805  BP: (!) 159/99 (!) 122/47  Pulse: 72   Resp: 20 16  Temp:  (!) 36.4 C  SpO2: 100% 100%    Last Pain:  Vitals:   04/20/18 1805  TempSrc: Oral  PainSc: 2                  Lowella CurbWarren Ray Amorita Vanrossum

## 2018-04-20 NOTE — H&P (Signed)
CC: Here today for surgery  HPI: [name] is a very pleasant 31yoM [facility] 01/04/18 with LLQ pain and increased frequency of urination. He was evaluated in the emergency room with CT abdomen and pelvis 01/04/18 which showed acute sigmoid diverticulitis, complicated by adjacent phlegmonous change and 3.7 cm abscess which was inseparable from the right anterior superior bladder wall. He was started on antibiotics and underwent IR drainage. He recovered well from this and was discharged 01/08/18. He has been doing well since that time. Since discharge, he denies any issues with fevers/chills/abdominal pain. He does have some soreness at his drain site. He has undergone multiple IR evaluations of his drain which had most recently shown collapse of his abscess cavity and a persistent fistula to the urinary bladder from the drain. In the office last month, he denied any issues with urination. He reported his drain output is intermittently brown in color.  He underwent colonoscopy with [facility]er [facility] which showed diverticulosis with some congested mucosa in the sigmoid and internal hemorrhoids but not polyps or other concerning findings.  He is doing well today. IR drain in place. No abdominal pain since drain placement at previous hospitalization. Tolerated his prep well with abx.  Past Medical History:  Diagnosis Date  . Depression   . Diverticulitis   . Hypertension    hx of no meds    Past Surgical History:  Procedure Laterality Date  . COLONOSCOPY    . IR RADIOLOGIST EVAL & MGMT  01/18/2018  . IR RADIOLOGIST EVAL & MGMT  02/01/2018  . IR RADIOLOGIST EVAL & MGMT  02/22/2018  . IR RADIOLOGIST EVAL & MGMT  03/22/2018  . NO PAST SURGERIES      Family History  Problem Relation Age of Onset  . Diabetes Father     Social:  reports that he has never smoked. He has never used smokeless tobacco. He reports that he does not drink alcohol or use drugs.  Allergies: No  Known Allergies  Medications: I have reviewed the patient's current medications.  No results found for this or any previous visit (from the past 48 hour(s)).  No results found.  ROS - all of the below systems have been reviewed with the patient and positives are indicated with bold text General: chills, fever or night sweats Eyes: blurry vision or double vision ENT: epistaxis or sore throat Allergy/Immunology: itchy/watery eyes or nasal congestion Hematologic/Lymphatic: bleeding problems, blood clots or swollen lymph nodes Endocrine: temperature intolerance or unexpected weight changes Breast: new or changing breast lumps or nipple discharge Resp: cough, shortness of breath, or wheezing CV: chest pain or dyspnea on exertion GI: as per HPI GU: dysuria, trouble voiding, or hematuria MSK: joint pain or joint stiffness Neuro: TIA or stroke symptoms Derm: pruritus and skin lesion changes Psych: anxiety and depression  PE There were no vitals taken for this visit. Constitutional: NAD; conversant; no deformities Eyes: Moist conjunctiva; no lid lag; anicteric; PERRL Neck: Trachea midline; no thyromegaly Lungs: Normal respiratory effort; no tactile fremitus CV: RRR; no palpable thrills; no pitting edema GI: Abd soft, NT/ND; IR drain in place RLQ; no palpable hepatosplenomegaly MSK: Normal gait; no clubbing/cyanosis Psychiatric: Appropriate affect; alert and oriented x3 Lymphatic: No palpable cervical or axillary lymphadenopathy  A/P: Robert Chen is an 32 y.o. male with hx of HTN - here today for surgery for his complicated diverticulitis and colovesical fistula  -Will plan laparoscopic, possible open, sigmoidectomy with cystoscopy/stents (Dr. Berneice HeinrichManny), flexible  sigmoidoscopy, possible repair of colovesical fistula, possible ostomy, all other indicated procedures -The anatomy and physiology of the GI tract was again discussed at length with the patient and his mother in law who is  with him today. The pathophysiology of diverticulitis was discussed at length with associated pictures. -We discussed operative indications and in his case, complicated diverticulitis. We discussed nonoperative management and its associated risks including recurrence, free perforation, sepsis/recurrent UTIs  -The planned procedure, material risks (including, but not limited to, pain, bleeding, infection, scarring, need for blood transfusion, damage to surrounding structures- blood vessels/nerves/viscus/organs, damage to ureter, urine leak, leak from anastomosis, bladder leak, need for additional procedures, need for stoma which may be permanent, hernia, recurrence, pneumonia, heart attack, stroke, death) benefits and alternatives to surgery were discussed at length. I noted a good probability that the procedure would help improve his condition. The patient's questions were answered to his satisfaction, he voiced understanding and elected to proceed with surgery. Additionally, we discussed typical postoperative expectations and the recovery process.  Stephanie Coup. Cliffton Asters, M.D. General and Colorectal Surgery [facility]

## 2018-04-20 NOTE — Brief Op Note (Signed)
04/20/2018  1:21 PM  PATIENT:  Robert Chen  32 y.o. male  PRE-OPERATIVE DIAGNOSIS:  COMPLICATED DIVERTICULITIS, COLEVESICAL FISTULA  POST-OPERATIVE DIAGNOSIS:  * No post-op diagnosis entered *  PROCEDURE:   Cystoscopy with BILATERAL retrograde pyelograms and ureteral sent placement   ASSISTANTS: none   ANESTHESIA:   general  EBL:  minimal   BLOOD ADMINISTERED:none  DRAINS: Rt green / Lt yellow ureteral stents + 56F 3 way foley to gravity drainage   LOCAL MEDICATIONS USED:  NONE  SPECIMEN:  No Specimen  DISPOSITION OF SPECIMEN:  N/A  COUNTS:  YES  TOURNIQUET:  * No tourniquets in log *  DICTATION: .Other Dictation: Dictation Number [id number]  PLAN OF CARE: remain in OR 1  PATIENT DISPOSITION:  remain in OR 1 for general surgery portions of surgery   Delay start of Pharmacological VTE agent (>24hrs) due to surgical blood loss or risk of bleeding: not applicable

## 2018-04-20 NOTE — Anesthesia Procedure Notes (Signed)
Procedure Name: Intubation Date/Time: 04/20/2018 12:55 PM Performed by: Talbot Grumbling, CRNA Pre-anesthesia Checklist: Patient identified, Emergency Drugs available, Suction available and Patient being monitored Patient Re-evaluated:Patient Re-evaluated prior to induction Oxygen Delivery Method: Circle system utilized Preoxygenation: Pre-oxygenation with 100% oxygen Induction Type: IV induction Ventilation: Mask ventilation without difficulty Laryngoscope Size: Mac and 3 Grade View: Grade I Tube type: Oral Tube size: 7.5 mm Number of attempts: 1 Airway Equipment and Method: Stylet Placement Confirmation: ETT inserted through vocal cords under direct vision,  positive ETCO2 and breath sounds checked- equal and bilateral Secured at: 22 cm Tube secured with: Tape Dental Injury: Teeth and Oropharynx as per pre-operative assessment

## 2018-04-20 NOTE — Op Note (Signed)
NAMSullivan Lone: Garden, Robert Chen MEDICAL RECORD ZO:[id number] ACCOUNT [id number] DATE OF BIRTH:1986-01-15 FACILITY: [facility] LOCATION: [facility] PHYSICIAN:Robert Lamia, MD  OPERATIVE REPORT  DATE OF PROCEDURE:  04/20/2018  PREOPERATIVE DIAGNOSES:  Severe diverticulitis with prior abscess and persistent  colovesical fistula.  PROCEDURE: 1.  Cystoscopy, bilateral pyelograms, interpretation. 2.  Insertion of bilateral ureteral stents, externalized.  ESTIMATED BLOOD LOSS:  Nil.  COMPLICATIONS:  None.  SPECIMENS:  None.  FINDINGS: 1.  Area of nodularity at dome of bladder consistent with known fistula tract.  The actual fistula tract was quite small.  No fecaluria grossly. 2.  Unremarkable bilateral pyelograms. 3.  Successful insertion of bilateral ureteral stents; right green, left yellow.  DRAINS: 1.  Right green externalized ureteral stent. 2.  Left yellow externalized ureteral stent. 3.  Foley catheter all connected via Layla BarterGoldberg adapter to gravity drainage.  INDICATIONS:  The patient is a very pleasant 32 year old gentleman with history of severe diverticulitis complicated by diverticular abscess with subsequent colovesical fistula.  This was diagnosed in 12/2017.  He was managed with percutaneous drainage  and prolonged antibiotics.  He unfortunately has had persistence of a colovesical fistula tract, and he presents for segmental colon resection under the care of the general surgery team.  Given the severity of diverticulitis, they have requested  perioperative ureteral stenting for ureteral identification and protection.  His imaging was reviewed, and this was felt to be suitable.  Informed consent was obtained and placed in the medical record.  PROCEDURE IN DETAIL:  The patient being identified, the procedure being cystoscopy and bilateral pyelograms were confirmed.  Procedure time-out was performed.  Intravenous antibiotics were administered.  General anesthesia was introduced.  The patient   was placed into a low-lithotomy position.  A sterile field was created by prepping and draping the penis, perineum, and proximal thighs using iodine.  Cystourethroscopy was performed using a 20-French rigid cystoscope with offset lens.  Inspection of  anterior urethra was unremarkable.  Inspection of the bladder revealed a nodularity in the dome of the bladder consistent with known fistula tract.  There were no papillary lesions worrisome for carcinoma.  The actual fistula tract itself was quite small  and unable to be directly visualized.  There was no obvious gross fecaluria.  Ureteral orifices appeared singleton bilaterally.  The left ureteral orifice was cannulated with a 5-French yellow open-ended catheter, and left retrograde pyelogram was  obtained.  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  No filling defects or narrowing noted.  Using fluoroscopic guidance, the open-ended catheter was advanced to the level of the upper pole and set aside.   Similarly, a right retrograde pyelogram was obtained using a green 6-French open-ended catheter.  Right retrograde pyelogram demonstrated a single right ureter with a single-system right kidney.   No filling defects or narrowing noted.  Under fluoroscopic guidance, the proximal end of the right stent was advanced over the lower pole.  A 16-French  3-way Foley catheter was placed per urethra to straight drain, 10 mL of water in the balloon.  The irrigation port was clamped.  All 3 drainage mechanisms were attached to a [place] adapter to gravity drainage.  A silk tie was used to fashion the  ureteral stents to the Foley catheter to prevent inadvertent displacement, and the procedure was terminated.  The patient did well.  There were no immediate perirenal complications.  The patient was then turned over to general surgery service for his  extirpative portion of  the surgery today.  LN/NUANCE   D:04/20/2018 T:04/20/2018 JOB:[id number]

## 2018-04-21 ENCOUNTER — Encounter (HOSPITAL_COMMUNITY): Payer: Self-pay | Admitting: Surgery

## 2018-04-21 LAB — BASIC METABOLIC PANEL
ANION GAP: 7 (ref 5–15)
BUN: 6 mg/dL (ref 6–20)
CO2: 28 mmol/L (ref 22–32)
CREATININE: 0.95 mg/dL (ref 0.61–1.24)
Calcium: 9.1 mg/dL (ref 8.9–10.3)
Chloride: 106 mmol/L (ref 98–111)
GLUCOSE: 118 mg/dL — AB (ref 70–99)
Potassium: 4.7 mmol/L (ref 3.5–5.1)
Sodium: 141 mmol/L (ref 135–145)

## 2018-04-21 LAB — CBC
HEMATOCRIT: 38.7 % — AB (ref 39.0–52.0)
HEMOGLOBIN: 12.1 g/dL — AB (ref 13.0–17.0)
MCH: 24.3 pg — ABNORMAL LOW (ref 26.0–34.0)
MCHC: 31.3 g/dL (ref 30.0–36.0)
MCV: 77.9 fL — ABNORMAL LOW (ref 78.0–100.0)
Platelets: 429 10*3/uL — ABNORMAL HIGH (ref 150–400)
RBC: 4.97 MIL/uL (ref 4.22–5.81)
RDW: 15.1 % (ref 11.5–15.5)
WBC: 13.2 10*3/uL — AB (ref 4.0–10.5)

## 2018-04-21 LAB — PHOSPHORUS: Phosphorus: 4.6 mg/dL (ref 2.5–4.6)

## 2018-04-21 LAB — MAGNESIUM: Magnesium: 2 mg/dL (ref 1.7–2.4)

## 2018-04-21 NOTE — Progress Notes (Signed)
Subjective No acute events. Feeling well. Denies n/v. Drinking lots of liquids without issue. Reports good pain control. Got up to chair overnight. Denies flatus/bm yet.  Objective: Vital signs in last 24 hours: Temp:  [97.5 F (36.4 C)-98.9 F (37.2 C)] 98.3 F (36.8 C) (06/27 0555) Pulse Rate:  [51-91] 68 (06/27 0555) Resp:  [9-20] 15 (06/27 0555) BP: (107-164)/(47-104) 135/78 (06/27 0555) SpO2:  [98 %-100 %] 100 % (06/27 0555) Weight:  [81.6 kg (180 lb)] 81.6 kg (180 lb) (06/26 1058) Last BM Date: 04/19/18  Intake/Output from previous day: 06/26 0701 - 06/27 0700 In: 1941.3 [I.V.:1841.3; IV Piggyback:100] Out: 2895 [Urine:2800; Drains:45; Blood:50] Intake/Output this shift: No intake/output data recorded.  Gen: NAD, comfortable CV: RRR Pulm: Normal work of breathing Abd: Soft, NT/ND; incisions dressed and dry. Drain with serosang output in bulb. Ext: SCDs in place  Lab Results: CBC  Recent Labs    04/20/18 1846 04/21/18 0404  WBC 15.9* 13.2*  HGB 12.2* 12.1*  HCT 39.4 38.7*  PLT 478* 429*   BMET Recent Labs    04/20/18 1846 04/21/18 0404  NA  --  141  K  --  4.7  CL  --  106  CO2  --  28  GLUCOSE  --  118*  BUN  --  6  CREATININE 0.93 0.95  CALCIUM  --  9.1   PT/INR No results for input(s): LABPROT, INR in the last 72 hours. ABG No results for input(s): PHART, HCO3 in the last 72 hours.  Invalid input(s): PCO2, PO2  Studies/Results:  Anti-infectives: Anti-infectives (From admission, onward)   Start     Dose/Rate Route Frequency Ordered Stop   04/20/18 1400  neomycin (MYCIFRADIN) tablet 1,000 mg  Status:  Discontinued     1,000 mg Oral 3 times per day 04/20/18 1113 04/20/18 1115   04/20/18 1400  metroNIDAZOLE (FLAGYL) tablet 1,000 mg  Status:  Discontinued     1,000 mg Oral 3 times per day 04/20/18 1113 04/20/18 1115   04/20/18 1115  cefoTEtan (CEFOTAN) 2 g in sodium chloride 0.9 % 100 mL IVPB     2 g 200 mL/hr over 30 Minutes Intravenous On  call to O.R. 04/20/18 1113 04/20/18 1315       Assessment/Plan: Patient Active Problem List   Diagnosis Date Noted  . S/P laparoscopic-assisted sigmoidectomy 04/20/2018  . Dysuria 01/04/2018  . Lower abdominal pain 01/04/2018  . Colonic diverticular abscess 01/04/2018  . Right lower quadrant abdominal pain 12/28/2017  . Obesity 03/12/2017  . Uses Spanish as primary spoken language 02/20/2017  . Sinus tachycardia 02/20/2017  . Hypertension   . Sigmoid diverticulitis with small perforatrion 02/19/2017   s/p Procedure(s): LAPAROSCOPIC  SIGMOIDECTOMY ,COLOPROSTOSTOMY, FLEXIBLE SIGMOIDOSCOPY CYSTOSCOPY WITH URETERAL STENT PLACEMENT 04/20/2018 POD#1  -Advance to full liquid diet -Awaiting return of bowel fxn - continue Entereg -Ambulate 5x/day -IS 10x/hr while awake -Continue foley catheter given prior colovesical fistula - will plan to keep in place x2 weeks, then obtain CT cysto for evaluation -PPx: SQH, SCDs   LOS: 1 day   Stephanie Couphristopher M. Cliffton AstersWhite, M.D. General and Colorectal Surgery [facility]

## 2018-04-22 LAB — CBC
HCT: 37.9 % — ABNORMAL LOW (ref 39.0–52.0)
Hemoglobin: 11.7 g/dL — ABNORMAL LOW (ref 13.0–17.0)
MCH: 24.2 pg — ABNORMAL LOW (ref 26.0–34.0)
MCHC: 30.9 g/dL (ref 30.0–36.0)
MCV: 78.3 fL (ref 78.0–100.0)
PLATELETS: 398 10*3/uL (ref 150–400)
RBC: 4.84 MIL/uL (ref 4.22–5.81)
RDW: 15.5 % (ref 11.5–15.5)
WBC: 8.5 10*3/uL (ref 4.0–10.5)

## 2018-04-22 LAB — BASIC METABOLIC PANEL
Anion gap: 9 (ref 5–15)
BUN: 10 mg/dL (ref 6–20)
CALCIUM: 9 mg/dL (ref 8.9–10.3)
CO2: 27 mmol/L (ref 22–32)
Chloride: 106 mmol/L (ref 98–111)
Creatinine, Ser: 1.17 mg/dL (ref 0.61–1.24)
Glucose, Bld: 83 mg/dL (ref 70–99)
Potassium: 3.8 mmol/L (ref 3.5–5.1)
SODIUM: 142 mmol/L (ref 135–145)

## 2018-04-22 MED ORDER — OXYCODONE HCL 5 MG PO TABS: 5.0000 mg | ORAL_TABLET | Freq: Four times a day (QID) | ORAL | 0 refills | Status: AC | PRN

## 2018-04-22 MED ORDER — OXYCODONE HCL 5 MG PO TABS: 5.0000 mg | ORAL_TABLET | Freq: Four times a day (QID) | ORAL | Status: DC | PRN

## 2018-04-22 MED ORDER — IBUPROFEN 200 MG PO TABS
600.0000 mg | ORAL_TABLET | Freq: Four times a day (QID) | ORAL | Status: DC | PRN
  Administered 2018-04-22: 600 mg via ORAL
  Filled 2018-04-22: qty 3

## 2018-04-22 NOTE — Discharge Instructions (Addendum)
POST OP INSTRUCTIONS  1. DIET: Avoid raw fruits/vegetables for the first 2-3 weeks after your surgery, otherwise diet as tolerated.  2. Take your usually prescribed home medications unless otherwise directed.  3. PAIN CONTROL: a. Pain is best controlled by a usual combination of three different methods TOGETHER: i. Ice/Heat ii. Over the counter pain medication iii. Prescription pain medication b. Most patients will experience some swelling and bruising around the surgical site.  Ice packs or heating pads (30-60 minutes up to 6 times a day) will help. Some people prefer to use ice alone, heat alone, alternating between ice & heat.  Experiment with what works for you.  Swelling and bruising can take several weeks to resolve.   c. It is helpful to take an over-the-counter pain medication regularly for the first few weeks: i. Ibuprofen (Motrin/Advil) - 200mg  tabs - take 3 tabs (600mg ) every 6 hours as needed for pain ii. Acetaminophen (Tylenol) - you may take 650mg  every 6 hours as needed. You can take this with motrin as they act differently on the body. If you are taking a narcotic pain medication that has acetaminophen in it, do not take over the counter tylenol at the same time.  Iii. NOTE: You may take both of these medications together - most patients  find it most helpful when alternating between the two (i.e. Ibuprofen at 6am,  tylenol at 9am, ibuprofen at 12pm ...) d. A  prescription for pain medication should be given to you upon discharge.  Take your pain medication as prescribed if your pain is not adequatly controlled with the over-the-counter pain reliefs mentioned above.  4. Avoid getting constipated.  Between the surgery and the pain medications, it is common to experience some constipation.  Increasing fluid intake and taking a fiber supplement (such as Metamucil, Citrucel, FiberCon, MiraLax, etc) 1-2 times a day regularly will usually help prevent this problem from occurring.  A mild  laxative (prune juice, Milk of Magnesia, MiraLax, etc) should be taken according to package directions if there are no bowel movements after 48 hours.    5. Dressing: Your incisions have staples in place. These can be removed 2 weeks out from surgery. Please call our office to schedule your staple removal appointment. Avoid baths/pools/lakes/oceans until your wounds have fully healed. Ok to shower normally and get soap/water over your incisions. 6. Foley Catheter: You have a leg bag attached to the urinary catheter. Empty as needed into the commode. The catheter will remain in place for 2 weeks - we will be contacting you to schedule a special CAT scan to evaluate and ensure the bladder has healed from the prior fistula. If the CAT scan shows everything is healed appropriately, we will remove the catheter in the clinic.  7. ACTIVITIES as tolerated:   a. Avoid heavy lifting (>10lbs or 1 gallon of milk) for the next 6 weeks. b. You may resume regular (light) daily activities beginning the next day--such as daily self-care, walking, climbing stairs--gradually increasing activities as tolerated.  If you can walk 30 minutes without difficulty, it is safe to try more intense activity such as jogging, treadmill, bicycling, low-impact aerobics.  c. DO NOT PUSH THROUGH PAIN.  Let pain be your guide: If it hurts to do something, don't do it. d. Bonita QuinYou may drive when you are no longer taking prescription pain medication, you can comfortably wear a seatbelt, and you can safely maneuver your car and apply brakes. e. Bonita QuinYou may have sexual intercourse when it  is comfortable.   8. FOLLOW UP in our office a. Please call [facility] at [phone] to set up an appointment to see your surgeon in the office for a follow-up appointment approximately 2 weeks after your surgery. b. Make sure that you call for this appointment the day you arrive home to insure a convenient appointment time.  9. If you have disability or family leave  forms that need to be completed, you may have them completed by your primary care physician's office; for return to work instructions, please ask our office staff and they will be happy to assist you in obtaining this documentation   When to call us [phone]: 1. Poor pain control 2. Reactions / problems with new medications (rash/itching, etc)  3. Fever over 101.5 F (38.5 C) 4. Inability to urinate 5. Nausea/vomiting 6. Worsening swelling or bruising 7. Continued bleeding from incision. 8. Increased pain, redness, or drainage from the incision  The clinic staff is available to answer your questions during regular business hours (8:30am-5pm).  Please dont hesitate to call and ask to speak to one of our nurses for clinical concerns.   A surgeon from [facility] is always on call at the hospitals   If you have a medical emergency, go to the nearest emergency room or call 911.  [facility] [address], [place], [place]  [address] MAIN: [phone] FAX: [phone] [email or web address]   Empty JP bulb daily.  You do not have to record the output.  Keep area around drain insertion site clean and dry.

## 2018-04-22 NOTE — Progress Notes (Signed)
Subjective No acute events. Feeling well. Denies n/v. Drinking lots of full liquids without issue. Reports good pain control. Ambulating. Passing lots of flatus - had BM yesterday x1 but was dark/old blood, not much stool per se.  Objective: Vital signs in last 24 hours: Temp:  [98 F (36.7 C)-98.5 F (36.9 C)] 98.1 F (36.7 C) (06/28 16100611) Pulse Rate:  [65-70] 70 (06/28 0611) Resp:  [15-18] 18 (06/28 0611) BP: (127-130)/(70-90) 127/82 (06/28 0611) SpO2:  [99 %-100 %] 99 % (06/28 0611) Weight:  [83.5 kg (184 lb)-84.3 kg (185 lb 12.8 oz)] 84.3 kg (185 lb 12.8 oz) (06/28 0618) Last BM Date: 04/19/18  Intake/Output from previous day: 06/27 0701 - 06/28 0700 In: 800 [P.O.:800] Out: 1465 [Urine:1400; Drains:65] Intake/Output this shift: No intake/output data recorded.  Gen: NAD, comfortable CV: RRR Pulm: Normal work of breathing Abd: Soft, NT/ND; incisions dressed and dry. Drain with serosang output in bulb. Ext: SCDs in place  Lab Results: CBC  Recent Labs    04/21/18 0404 04/22/18 0417  WBC 13.2* 8.5  HGB 12.1* 11.7*  HCT 38.7* 37.9*  PLT 429* 398   BMET Recent Labs    04/21/18 0404 04/22/18 0417  NA 141 142  K 4.7 3.8  CL 106 106  CO2 28 27  GLUCOSE 118* 83  BUN 6 10  CREATININE 0.95 1.17  CALCIUM 9.1 9.0   PT/INR No results for input(s): LABPROT, INR in the last 72 hours. ABG No results for input(s): PHART, HCO3 in the last 72 hours.  Invalid input(s): PCO2, PO2  Studies/Results:  Anti-infectives: Anti-infectives (From admission, onward)   Start     Dose/Rate Route Frequency Ordered Stop   04/20/18 1400  neomycin (MYCIFRADIN) tablet 1,000 mg  Status:  Discontinued     1,000 mg Oral 3 times per day 04/20/18 1113 04/20/18 1115   04/20/18 1400  metroNIDAZOLE (FLAGYL) tablet 1,000 mg  Status:  Discontinued     1,000 mg Oral 3 times per day 04/20/18 1113 04/20/18 1115   04/20/18 1115  cefoTEtan (CEFOTAN) 2 g in sodium chloride 0.9 % 100 mL IVPB     2  g 200 mL/hr over 30 Minutes Intravenous On call to O.R. 04/20/18 1113 04/20/18 1315       Assessment/Plan: Patient Active Problem List   Diagnosis Date Noted  . S/P laparoscopic-assisted sigmoidectomy 04/20/2018  . Dysuria 01/04/2018  . Lower abdominal pain 01/04/2018  . Colonic diverticular abscess 01/04/2018  . Right lower quadrant abdominal pain 12/28/2017  . Obesity 03/12/2017  . Uses Spanish as primary spoken language 02/20/2017  . Sinus tachycardia 02/20/2017  . Hypertension   . Sigmoid diverticulitis with small perforatrion 02/19/2017   s/p Procedure(s): LAPAROSCOPIC  SIGMOIDECTOMY ,COLOPROSTOSTOMY, FLEXIBLE SIGMOIDOSCOPY CYSTOSCOPY WITH URETERAL STENT PLACEMENT 04/20/2018 POD#2  -Advance to GI soft diet -Drain creatinine, if normal, will plan JP removal tomorrow -I have asked his nurse to exchange foley from 3 way to single; leg bag today as well. Continue foley catheter given prior colovesical fistula - will plan to keep in place x2 weeks, then obtain CT cysto for evaluation -Ambulate 5x/day -IS 10x/hr while awake -PPx: SQH, SCDs -Dispo: Possible discharge tomorrow if progressing appropriately, tolerating diet, etc   LOS: 2 days   Stephanie Couphristopher M. Cliffton AstersWhite, M.D. General and Colorectal Surgery [facility]

## 2018-04-23 LAB — BASIC METABOLIC PANEL
Anion gap: 11 (ref 5–15)
BUN: 12 mg/dL (ref 6–20)
CALCIUM: 8.9 mg/dL (ref 8.9–10.3)
CO2: 26 mmol/L (ref 22–32)
CREATININE: 1.12 mg/dL (ref 0.61–1.24)
Chloride: 104 mmol/L (ref 98–111)
Glucose, Bld: 87 mg/dL (ref 70–99)
Potassium: 3.8 mmol/L (ref 3.5–5.1)
SODIUM: 141 mmol/L (ref 135–145)

## 2018-04-23 NOTE — Progress Notes (Signed)
Subjective Tolerating full liquids. Hasn't had solid diet yet.  Reports good pain control with tylenol. Ambulating. Passing lots of flatus - no real BM yet.  Objective: Vital signs in last 24 hours: Temp:  [98 F (36.7 C)-98.4 F (36.9 C)] 98.3 F (36.8 C) (06/29 0446) Pulse Rate:  [70-86] 75 (06/29 0446) Resp:  [18-20] 18 (06/29 0446) BP: (131-136)/(84-94) 131/88 (06/29 0446) SpO2:  [100 %] 100 % (06/29 0446) Weight:  [84.2 kg (185 lb 9.6 oz)] 84.2 kg (185 lb 9.6 oz) (06/29 0500) Last BM Date: 04/22/18  Intake/Output from previous day: 06/28 0701 - 06/29 0700 In: 820 [P.O.:820] Out: 1285 [Urine:1225; Drains:60] Intake/Output this shift: No intake/output data recorded.  Gen: NAD, comfortable CV: RRR Pulm: Normal work of breathing Abd: Soft, NT/ND; incisions dressed and dry. Drain with serosang output in bulb. Ext: SCDs in place  Lab Results: CBC  Recent Labs    04/21/18 0404 04/22/18 0417  WBC 13.2* 8.5  HGB 12.1* 11.7*  HCT 38.7* 37.9*  PLT 429* 398   BMET Recent Labs    04/22/18 0417 04/23/18 0414  NA 142 141  K 3.8 3.8  CL 106 104  CO2 27 26  GLUCOSE 83 87  BUN 10 12  CREATININE 1.17 1.12  CALCIUM 9.0 8.9   PT/INR No results for input(s): LABPROT, INR in the last 72 hours. ABG No results for input(s): PHART, HCO3 in the last 72 hours.  Invalid input(s): PCO2, PO2  Studies/Results:  Anti-infectives: Anti-infectives (From admission, onward)   Start     Dose/Rate Route Frequency Ordered Stop   04/20/18 1400  neomycin (MYCIFRADIN) tablet 1,000 mg  Status:  Discontinued     1,000 mg Oral 3 times per day 04/20/18 1113 04/20/18 1115   04/20/18 1400  metroNIDAZOLE (FLAGYL) tablet 1,000 mg  Status:  Discontinued     1,000 mg Oral 3 times per day 04/20/18 1113 04/20/18 1115   04/20/18 1115  cefoTEtan (CEFOTAN) 2 g in sodium chloride 0.9 % 100 mL IVPB     2 g 200 mL/hr over 30 Minutes Intravenous On call to O.R. 04/20/18 1113 04/20/18 1315        Assessment/Plan: Patient Active Problem List   Diagnosis Date Noted  . S/P laparoscopic-assisted sigmoidectomy 04/20/2018  . Dysuria 01/04/2018  . Lower abdominal pain 01/04/2018  . Colonic diverticular abscess 01/04/2018  . Right lower quadrant abdominal pain 12/28/2017  . Obesity 03/12/2017  . Uses Spanish as primary spoken language 02/20/2017  . Sinus tachycardia 02/20/2017  . Hypertension   . Sigmoid diverticulitis with small perforatrion 02/19/2017   s/p Procedure(s): LAPAROSCOPIC  SIGMOIDECTOMY ,COLOPROSTOSTOMY, FLEXIBLE SIGMOIDOSCOPY CYSTOSCOPY WITH URETERAL STENT PLACEMENT 04/20/2018 POD#3  -Advance to GI soft diet -awaiting drain creatinine, if normal, will plan JP removal tomorrow -cont foley: will switch to leg bag when he goes home.    - will plan to keep in place x2 weeks, then obtain CT cysto for evaluation -Ambulate 5x/day -IS 10x/hr while awake -PPx: SQH, SCDs -Dispo: Possible discharge tomorrow if progressing appropriately, tolerating diet and JP Cr ok  LOS: 3 days   Vanita PandaAlicia C Katarzyna Wolven, MD  Colorectal and General Surgery [facility]

## 2018-04-24 LAB — BASIC METABOLIC PANEL
Anion gap: 8 (ref 5–15)
BUN: 12 mg/dL (ref 6–20)
CO2: 28 mmol/L (ref 22–32)
CREATININE: 1.14 mg/dL (ref 0.61–1.24)
Calcium: 9.1 mg/dL (ref 8.9–10.3)
Chloride: 106 mmol/L (ref 98–111)
GFR calc Af Amer: >60 mL/min (ref >60)
Glucose, Bld: 91 mg/dL (ref 70–99)
Potassium: 4 mmol/L (ref 3.5–5.1)
SODIUM: 142 mmol/L (ref 135–145)

## 2018-04-24 NOTE — Progress Notes (Signed)
Discharge instructions reviewed with patient. All questions answered. Teaching given to patient about JP drain and dressings. Patient wheeled to vehicle with belongings by nurse tech.

## 2018-04-24 NOTE — Discharge Summary (Signed)
Physician Discharge Summary  Patient ID: Robert Chen MRN: [id number] DOB/AGE: [birth year] 34 y.o.  Admit date: 04/20/2018 Discharge date: 04/24/2018  Admission Diagnoses: colovesical fistula  Discharge Diagnoses: colovesical fistula Active Problems:   S/P laparoscopic-assisted sigmoidectomy   Discharged Condition: good  Hospital Course: Pt admitted after surgery.  Diet was advanced as tolerated.  Pt was discharged once pain controlled, tolerating a diet and having good bowel function.  Pt will be discharged with foley with plans for CT cysto in 2 weeks.  He will also be discharged with JP drain which can be pulled once JP cr lab work is finalized.  Consults: None  Significant Diagnostic Studies: labs: cbc, bmet  Treatments: IV hydration, analgesia: oxycodone and surgery: lap sigmoidectomy  Discharge Exam: Blood pressure (!) 142/97, pulse 81, temperature 98 F (36.7 C), temperature source Oral, resp. rate 14, height 5\' 9"  (1.753 m), weight 81.8 kg (180 lb 4.8 oz), SpO2 100 %. General appearance: alert and cooperative GI: normal findings: soft, non-tender Incision/Wound:clean, dry, intact  Disposition: Discharge disposition: 01-Home or Self Care        Allergies as of 04/24/2018   No Known Allergies     Medication List    STOP taking these medications   metroNIDAZOLE 500 MG tablet Commonly known as:  FLAGYL   neomycin 500 MG tablet Commonly known as:  MYCIFRADIN   polyethylene glycol-electrolytes 420 g solution Commonly known as:  NuLYTELY/GoLYTELY   Saline Flush 0.9 % Soln     TAKE these medications   oxyCODONE 5 MG immediate release tablet Commonly known as:  ROXICODONE Take 1 tablet (5 mg total) by mouth every 6 (six) hours as needed for up to 5 days (acute postop pain not controlled with tylenol and ibuprofen).        Signed: Marzetta Lanza C. 04/24/2018, 7:59 AM

## 2018-04-24 NOTE — Progress Notes (Signed)
Pt declined Heparin sq this morning stating that he was going to get up and sit of chair most of the day and walk a lot because he is trying to get well to go home. Rested quietly at long intervals with eyes closed, resp even and unlabored. No acute distress noted. Uneventful night.

## 2018-04-24 NOTE — Progress Notes (Signed)
MD paged due to lab notifying that past JP sample was not processed. Patient was discharge. Awaiting response.

## 2018-04-25 ENCOUNTER — Other Ambulatory Visit (HOSPITAL_COMMUNITY): Payer: Self-pay | Admitting: Surgery

## 2018-04-25 DIAGNOSIS — K5792 Diverticulitis of intestine, part unspecified, without perforation or abscess without bleeding: Secondary | ICD-10-CM

## 2018-04-26 ENCOUNTER — Other Ambulatory Visit (HOSPITAL_COMMUNITY): Payer: Self-pay | Admitting: Surgery

## 2018-04-26 DIAGNOSIS — K5792 Diverticulitis of intestine, part unspecified, without perforation or abscess without bleeding: Secondary | ICD-10-CM

## 2018-05-03 ENCOUNTER — Ambulatory Visit (HOSPITAL_COMMUNITY)
Admission: RE | Admit: 2018-05-03 | Discharge: 2018-05-03 | Disposition: A | Discharge status: Home / Self Care | Payer: Self-pay | Source: Ambulatory Visit | Attending: Surgery | Admitting: Surgery

## 2018-05-03 ENCOUNTER — Encounter (HOSPITAL_COMMUNITY): Payer: Self-pay

## 2018-05-03 DIAGNOSIS — K5792 Diverticulitis of intestine, part unspecified, without perforation or abscess without bleeding: Secondary | ICD-10-CM

## 2018-05-03 DIAGNOSIS — N3090 Cystitis, unspecified without hematuria: Secondary | ICD-10-CM | POA: Insufficient documentation

## 2018-05-03 MED ORDER — IOTHALAMATE MEGLUMINE 17.2 % UR SOLN
500.0000 mL | Freq: Once | URETHRAL | Status: AC | PRN
  Administered 2018-05-03: 200 mL via INTRAVESICAL

## 2018-05-03 MED ORDER — IOHEXOL 300 MG/ML  SOLN
100.0000 mL | Freq: Once | INTRAMUSCULAR | Status: AC | PRN
  Administered 2018-05-03: 100 mL via INTRAVENOUS

## 2018-05-05 ENCOUNTER — Ambulatory Visit (HOSPITAL_COMMUNITY): Payer: Self-pay

## 2018-05-20 ENCOUNTER — Encounter: Payer: Self-pay | Admitting: Nurse Practitioner

## 2019-03-18 IMAGING — RF DG CYSTOGRAM 3+V
7 series · 7 of 7 positions shown · non-contrast
Comparison: CT abdomen/pelvis dated 01/18/2018

CLINICAL DATA: Diverticulitis with colovesical fistula at dome a
bladder, status post sigmoidectomy, evaluate for patent
fistula/bladder leak prior to catheter removal

EXAM:
CYSTOGRAM
TECHNIQUE: After catheterization of the urinary bladder following sterile
technique the bladder was filled with 200 mL Cysto-Hypaque 30% by
drip infusion. Serial spot images were obtained during bladder
filling and post draining.
FLUOROSCOPY TIME:  Fluoroscopy Time:  30 seconds
Radiation Exposure Index (if provided by the fluoroscopic device):
1.1 mGy
Number of Acquired Spot Images: 6

[Series 1: t abdomen supine · 0.15mm/px · 1 of 1 slices shown]
[im 1/1]
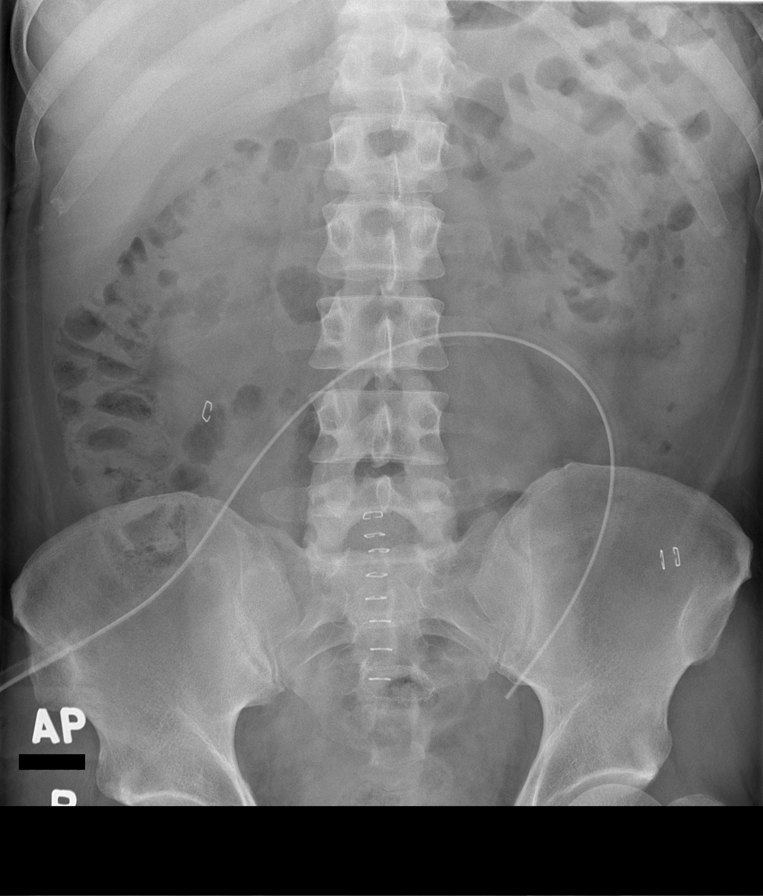

[Series 2: cp_standard · 0.20mm/px · 1 of 1 slices shown (1 of 5)]
[im 1/1]
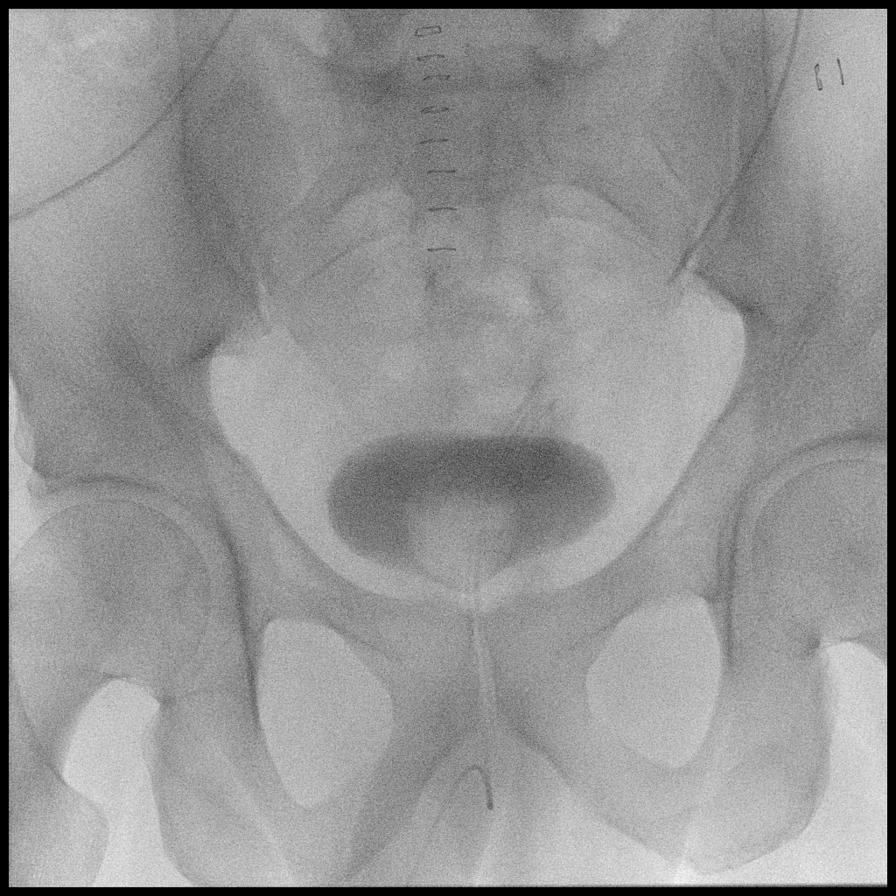

[Series 3: cp_standard · 0.18mm/px · 1 of 1 slices shown (2 of 5)]
[im 1/1]
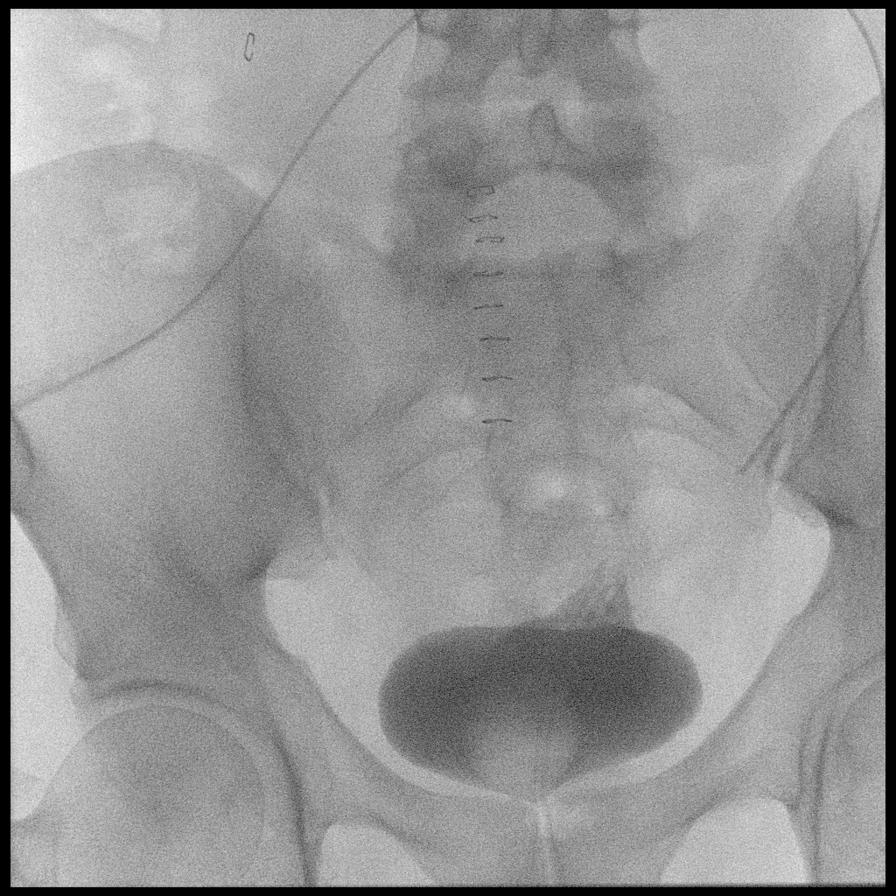

[Series 4: cp_standard · 0.19mm/px · 1 of 1 slices shown (3 of 5)]
[im 1/1]
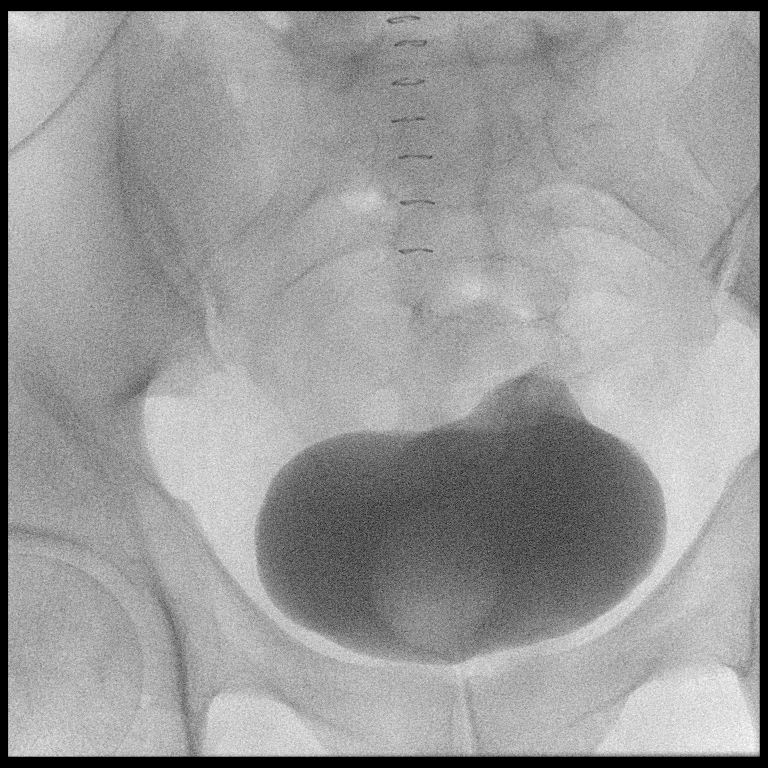

[Series 5: cp_standard · 0.18mm/px · 1 of 1 slices shown (4 of 5)]
[im 1/1]
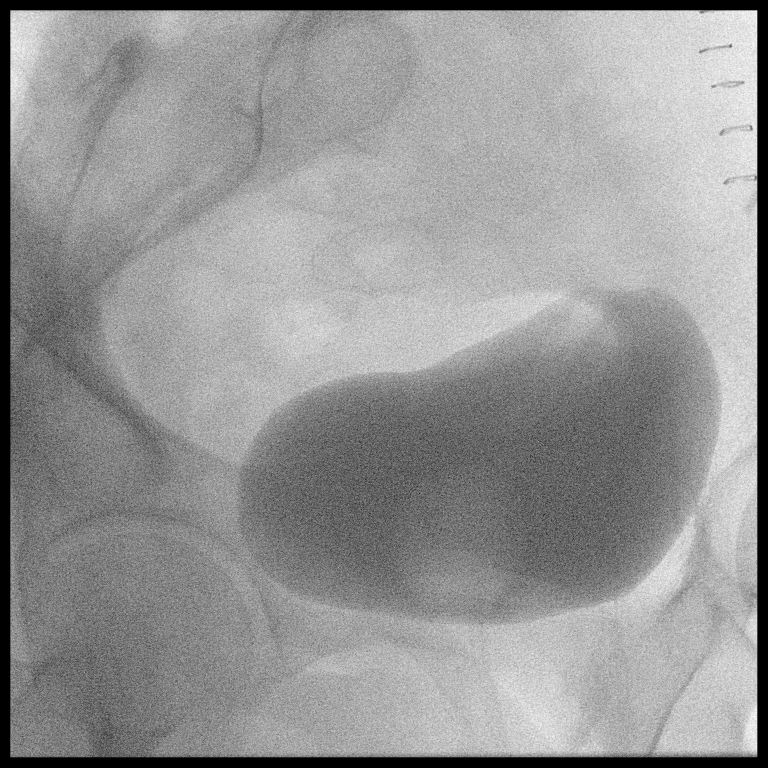

[Series 6: cp_standard · 0.18mm/px · 1 of 1 slices shown (5 of 5)]
[im 1/1]
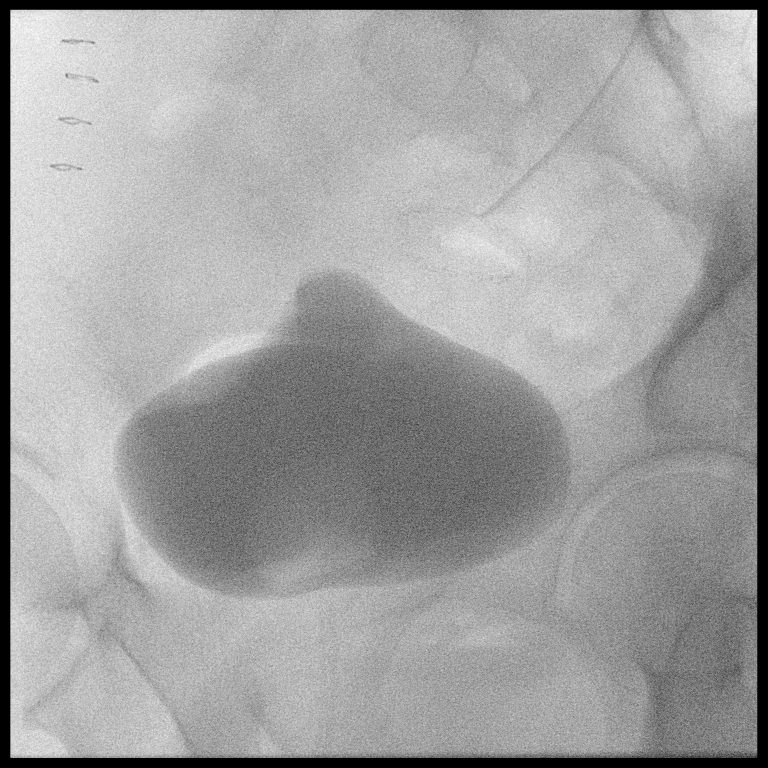

[Series 7: fluoro_iodine 2fps_bw · 0.18mm/px · 1 of 1 slices shown]
[im 1/1]
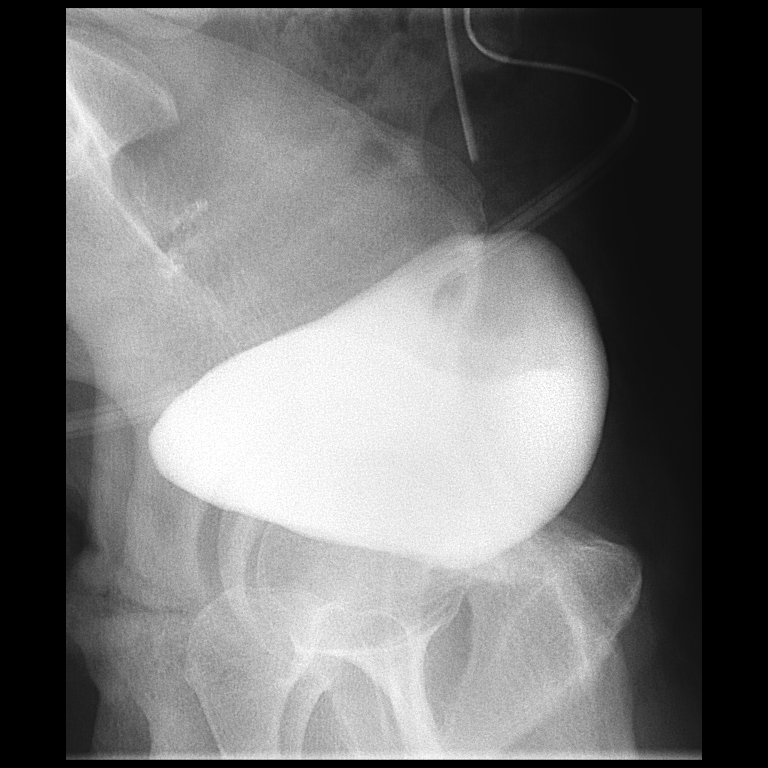

[7 of 7 positions shown; findings below may reference images not displayed]

FINDINGS: Mild irregularity along the bladder dome, postsurgical.

Indwelling Foley catheter.

No evidence of bladder leak.

No opacified bowel loop to suggest a patent colovesical fistula.

Surgical drain and midline skin staples.
IMPRESSION: No evidence of bladder leak or patent colovesical fistula.

## 2019-03-18 IMAGING — CT CT ABD-PELV W/ CM
2 of 5 series · 13 of 46 positions shown, 15 images · IV contrast (omnipaque)
Comparison: Fluoroscopic cystogram earlier the same day.

CT Abdomen and Pelvis 01/18/2018 and earlier.

CLINICAL DATA: 32-year-old male with severe diverticulitis, prior
abscess, persistent colovesical fistula at the bladder dome. Status
post sigmoidectomy on 04/20/2018.

EXAM:
CT ABDOMEN AND PELVIS WITH CONTRAST
TECHNIQUE: Multidetector CT imaging of the abdomen and pelvis was performed
using the standard protocol following bolus administration of
intravenous contrast.
CONTRAST:  100mL OMNIPAQUE IOHEXOL 300 MG/ML  SOLN

[Series 3: a/p w/ 5mm · axial · 0.75mm/px · z∈[+748,+1198]mm · 10 of 102 slices shown, 12 images]
[im 6/102  soft-tissue]
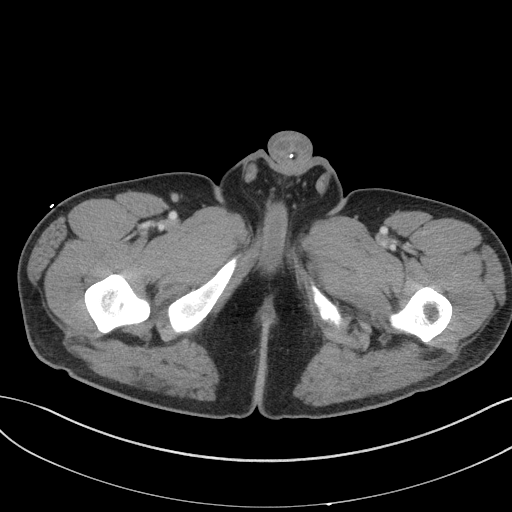
[im 6/102  bone]
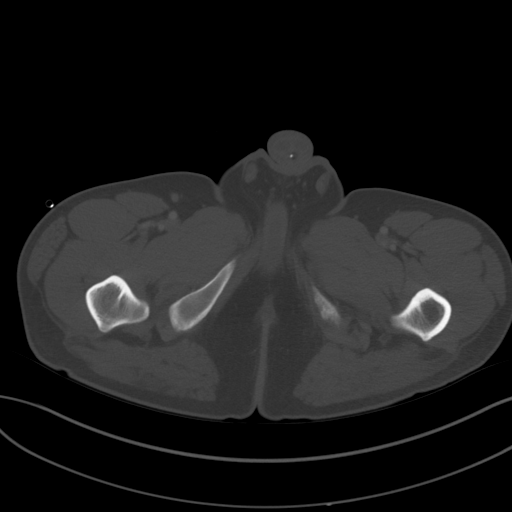
[im 18/102  soft-tissue]
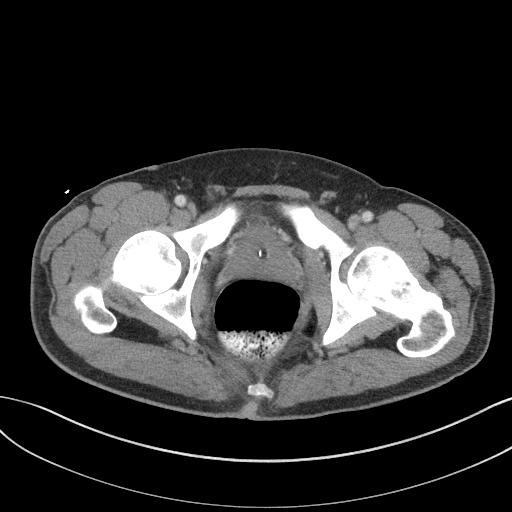
[im 30/102  soft-tissue]
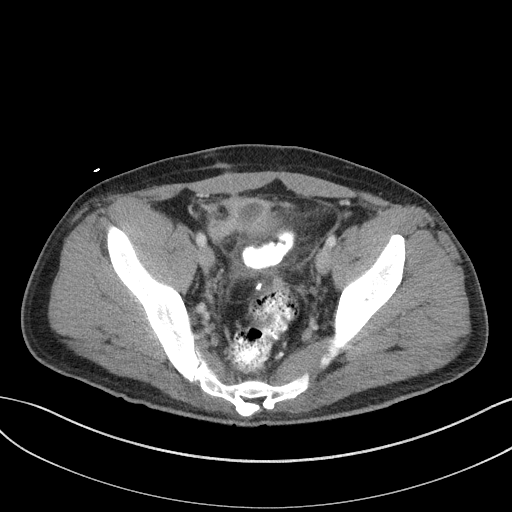
[im 36/102  soft-tissue]
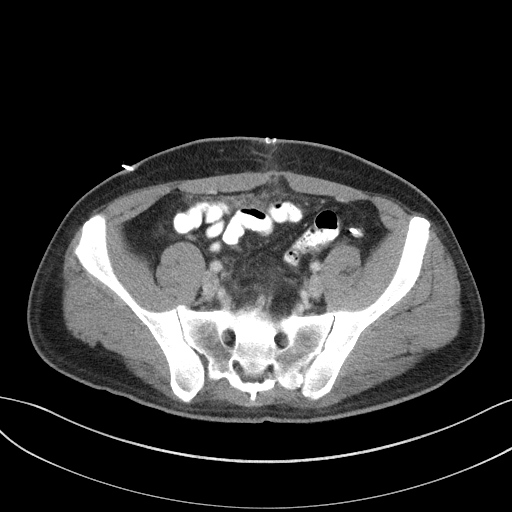
[im 48/102  soft-tissue]
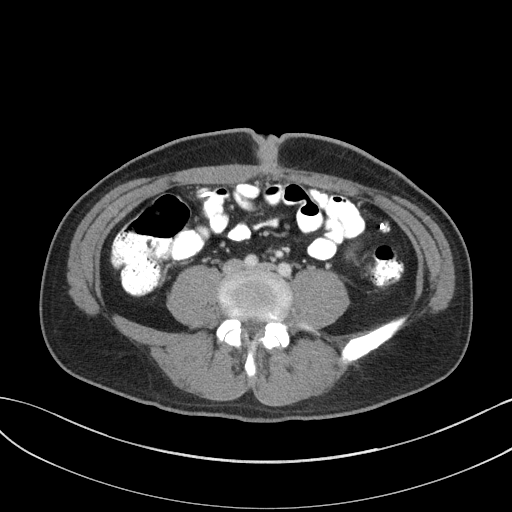
[im 54/102  soft-tissue]
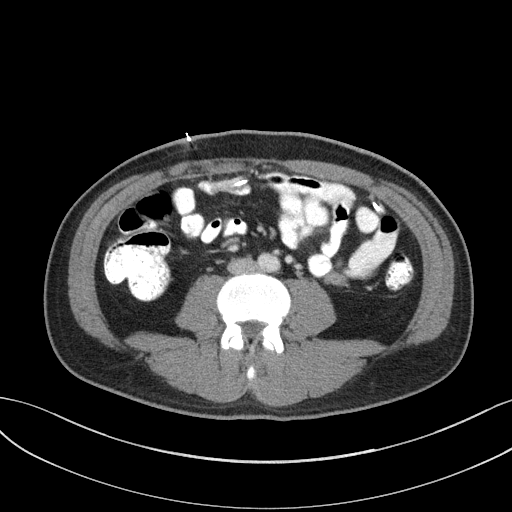
[im 66/102  soft-tissue]
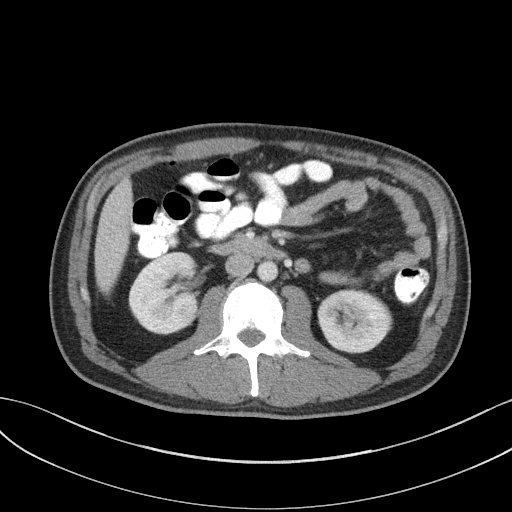
[im 78/102  soft-tissue]
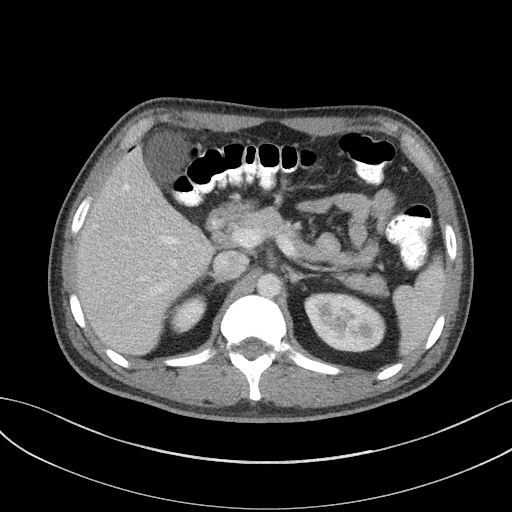
[im 84/102  soft-tissue]
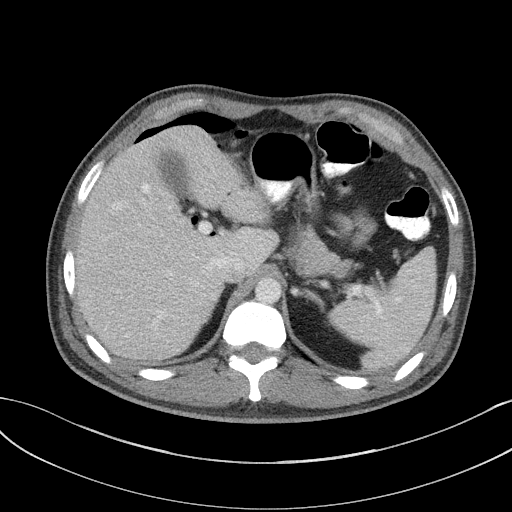
[im 84/102  bone]
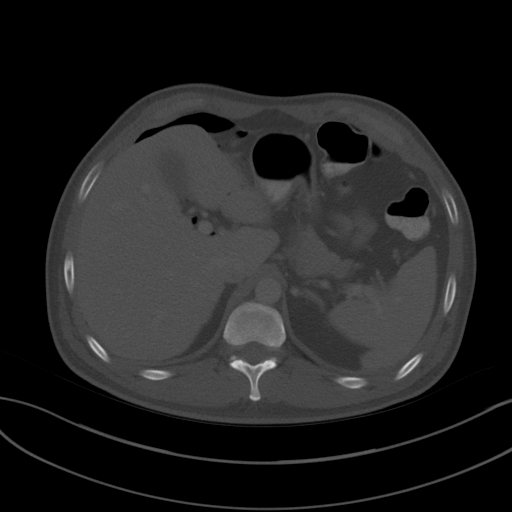
[im 96/102  soft-tissue]
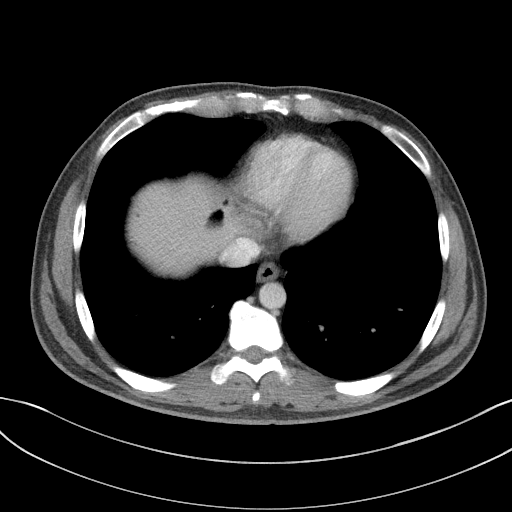

[Series 6: a/p w/ cor · coronal · 0.69mm/px · 3 of 119 slices shown]
[im 40/119  soft-tissue]
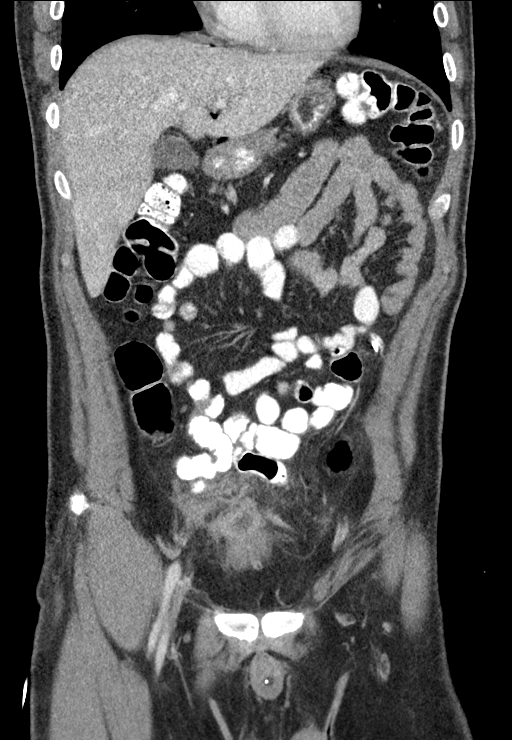
[im 53/119  soft-tissue]
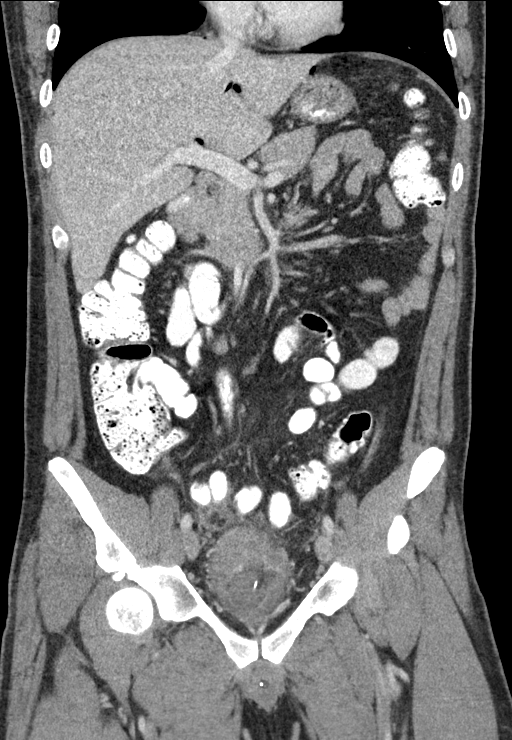
[im 66/119  soft-tissue]
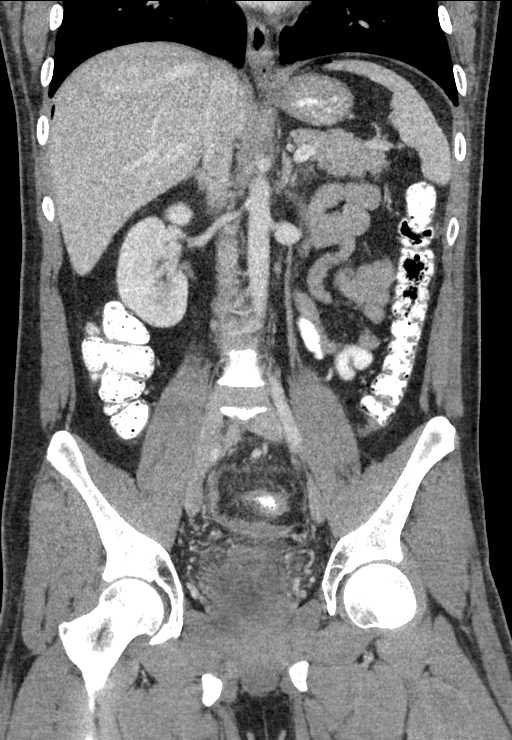

[13 of 46 positions shown; findings below may reference images not displayed]

FINDINGS: Lower chest: Normal lung bases. No pericardial or pleural effusion.

Hepatobiliary: Normal liver enhancement. Negative gallbladder.
Pneumoperitoneum is described below.

Pancreas: Negative.

Spleen: Negative.

Adrenals/Urinary Tract: Normal adrenal glands.

Similar renal enhancement and contrast excretion in both kidneys,
although there are occasional small areas of mildly striated
nephrogram (series 8, image 16 on the left),. However, there is no
perinephric stranding. No hydronephrosis. Decompressed proximal
ureters. No periureteral stranding.

Moderate inflammatory stranding begins at the anterior bladder inlet
and continues to the bladder dome where a 2.5 centimeter rim
enhancing fluid collection inseparable from the thickened bladder
dome corresponds to the site of previous percutaneous abscess
drainage earlier this year. See series 3, image 74, coronal image
45. The bladder walls extremely thick throughout, appears boggy.
Similar indistinct appearance of the seminal vesicles. The sigmoid
colon has been resected from this region. There are small bowel
loops in the region, but no persistent bowel fistula. Trace regional
free fluid. The urinary bladder is decompressed by a Foley catheter.

Stomach/Bowel: There is a small to moderate volume of
pneumoperitoneum in the upper abdomen with scattered free air, the
largest pocket is along the anterior liver (series 3, image 18).
Scattered small foci of gas are noted under the diaphragm and along
the posterior liver contour.

No free fluid in the abdomen.

Oral contrast administered and has reached the rectum without
obstruction. Negative rectum. There is a rectosigmoid anastomosis on
series 3, image 70 with only mild surrounding inflammatory stranding
and no adverse features. The residual sigmoid colon is within normal
limits.

A percutaneous surgical drain courses across the mid abdomen and
terminates along the lateral proximal sigmoid colon on series 3,
image 66.

Negative descending colon. Negative transverse colon aside from
adjacent small foci of free air. Negative right colon and appendix.

Negative terminal ileum. No dilated or inflamed small bowel loops,
although distal small bowel is in proximity to the abnormal bladder
dome. Negative stomach aside from small adjacent foci of free air.
Negative duodenum.

Vascular/Lymphatic: Major arterial structures are patent and appear
normal. Portal venous system appears patent.

No lymphadenopathy; small reactive right external iliac chains nodes
are stable since [REDACTED].

Reproductive: Foley catheter in place, otherwise negative.

Other: Trace pelvic free fluid, primarily along the ventral rectum
and sigmoid.

Musculoskeletal: No acute osseous abnormality identified.
IMPRESSION: 1. Small to moderate volume of pneumoperitoneum in the upper
abdomen.
Although somewhat beyond that expected for 13 days postop the
rectosigmoid anastomosis appears satisfactory, with no pelvic gas or
peri-anastomotic fluid, and there is no upper abdominal bowel
inflammation or abnormality is. Perhaps this gas is related to the
persistent surgical drain.

2. Good appearance of the rectosigmoid anastomosis. No bowel
obstruction.
3. Persistent 2.5 cm abscess at the anterior bladder dome,
corresponding to the same site which was being drained in [REDACTED].
Continued moderate to severe inflammation of the bladder wall. Foley
catheter draining the bladder.
4. Persistent inflammation and small volume of free fluid in the
pelvis secondary to #3.

This study was discussed by telephone with Dr. LUSINE JIM on

## 2020-02-03 ENCOUNTER — Ambulatory Visit: Payer: Self-pay | Attending: Internal Medicine

## 2020-02-03 DIAGNOSIS — Z23 Encounter for immunization: Secondary | ICD-10-CM

## 2020-02-03 NOTE — Progress Notes (Signed)
   Covid-19 Vaccination [facility]  Name:  Robert Chen    MRN: [id number] DOB: [birth year]  02/03/2020  [name] was observed post Covid-19 immunization for 15 minutes without incident. He was provided with Vaccine Information Sheet and instruction to access the V-Safe system.   [name] was instructed to call 911 with any severe reactions post vaccine: Marland Kitchen Difficulty breathing  . Swelling of face and throat  . A fast heartbeat  . A bad rash all over body  . Dizziness and weakness   Immunizations Administered    Name Date Dose VIS Date Route   Pfizer COVID-19 Vaccine 02/03/2020  4:46 PM 0.3 mL 10/06/2019 Intramuscular   Manufacturer: [employer], [employer]   Lot: [id number]   NDC: [address]-4

## 2020-02-27 ENCOUNTER — Ambulatory Visit: Payer: Self-pay | Attending: Internal Medicine

## 2020-02-27 DIAGNOSIS — Z23 Encounter for immunization: Secondary | ICD-10-CM

## 2020-02-27 NOTE — Progress Notes (Signed)
   Covid-19 Vaccination Clinic  Name:  Jailon Schaible    MRN: [id number] DOB: 1986/07/06  02/27/2020  Mr. Kiehn was observed post Covid-19 immunization for 15 minutes without incident. He was provided with Vaccine Information Sheet and instruction to access the V-Safe system.   Mr. Genova was instructed to call 911 with any severe reactions post vaccine: Marland Kitchen Difficulty breathing  . Swelling of face and throat  . A fast heartbeat  . A bad rash all over body  . Dizziness and weakness   Immunizations Administered    Name Date Dose VIS Date Route   Pfizer COVID-19 Vaccine 02/27/2020  4:35 PM 0.3 mL 12/20/2018 Intramuscular   Manufacturer: [employer], [employer]   Lot: [id number]   NDC: [address]-5
